# Patient Record
Sex: Female | Born: 1980 | Race: Black or African American | Hispanic: No | Marital: Married | Smoking: Never smoker
Health system: Southern US, Community
[De-identification: ages and names within clinical notes are randomized; demographics above are authoritative.]

## PROBLEM LIST (undated history)

## (undated) DIAGNOSIS — Z789 Other specified health status: Secondary | ICD-10-CM

## (undated) HISTORY — DX: Other specified health status: Z78.9

---

## 2018-05-02 ENCOUNTER — Ambulatory Visit: Payer: Self-pay | Attending: Family Medicine | Admitting: Family Medicine

## 2018-05-02 ENCOUNTER — Encounter: Payer: Self-pay | Admitting: Family Medicine

## 2018-05-02 VITALS — BP 146/87 | HR 79 | Temp 97.4°F | Wt 169.2 lb

## 2018-05-02 DIAGNOSIS — N926 Irregular menstruation, unspecified: Secondary | ICD-10-CM | POA: Insufficient documentation

## 2018-05-02 DIAGNOSIS — N925 Other specified irregular menstruation: Secondary | ICD-10-CM

## 2018-05-02 DIAGNOSIS — Z3202 Encounter for pregnancy test, result negative: Secondary | ICD-10-CM

## 2018-05-02 DIAGNOSIS — Z79899 Other long term (current) drug therapy: Secondary | ICD-10-CM | POA: Insufficient documentation

## 2018-05-02 DIAGNOSIS — R030 Elevated blood-pressure reading, without diagnosis of hypertension: Secondary | ICD-10-CM | POA: Insufficient documentation

## 2018-05-02 LAB — POCT URINE PREGNANCY: Preg Test, Ur: NEGATIVE

## 2018-05-02 MED ORDER — NORGESTIM-ETH ESTRAD TRIPHASIC 0.18/0.215/0.25 MG-25 MCG PO TABS: 1.0000 | ORAL_TABLET | Freq: Every day | ORAL | 11 refills | Status: DC

## 2018-05-02 MED FILL — NORG-EE 0.18-0.215-0.25/0.0: 0.18/0.215/ | 28 days supply | Qty: 28 | Fill #0

## 2018-05-02 NOTE — Progress Notes (Signed)
Subjective:  Patient ID: Marcia Hobbs, female    DOB: Apr 01, 1981  Age: 37 y.o. MRN: [id number]  CC: New Patient (Initial Visit)   HPI Marcia Hobbs is a 38 year old female from [place] who moved to the [place] 1and1/2 years ago and presents as a new patient to establish care and complains of irregular periods since she weaned her son in 10/2017. Prior to that she was having a normal menstrual cycle but afterwards had a cycle in August and the next one, 3 months later in November.  Length of cycle was 7 days and associated with heavy bleeding but no passage of clots, no abdominal cramping or pain, no nausea or vomiting. She is interested in conceiving again.   History reviewed. No pertinent past medical history.    Past Surgical History:  Procedure Laterality Date  . CESAREAN SECTION       Family history: Patient is unaware of family history  No outpatient medications prior to visit.   No facility-administered medications prior to visit.     ROS Review of Systems  Constitutional: Negative for activity change, appetite change and fatigue.  HENT: Negative for congestion, sinus pressure and sore throat.   Eyes: Negative for visual disturbance.  Respiratory: Negative for cough, chest tightness, shortness of breath and wheezing.   Cardiovascular: Negative for chest pain and palpitations.  Gastrointestinal: Negative for abdominal distention, abdominal pain and constipation.  Endocrine: Negative for polydipsia.  Genitourinary: Negative for dysuria and frequency.  Musculoskeletal: Negative for arthralgias and back pain.  Skin: Negative for rash.  Neurological: Negative for tremors, light-headedness and numbness.  Hematological: Does not bruise/bleed easily.  Psychiatric/Behavioral: Negative for agitation and behavioral problems.    Objective:  BP (!) 146/87   Pulse 79   Temp (!) 97.4 F (36.3 C) (Oral)   Wt 169 lb 3.2 oz (76.7 kg)   SpO2 100%    BP/Weight 05/02/2018  Systolic BP 146  Diastolic BP 87  Wt. (Lbs) 169.2      Physical Exam Constitutional:      Appearance: She is well-developed.  Cardiovascular:     Rate and Rhythm: Normal rate.     Heart sounds: Normal heart sounds. No murmur.  Pulmonary:     Effort: Pulmonary effort is normal.     Breath sounds: Normal breath sounds. No wheezing or rales.  Chest:     Chest wall: No tenderness.  Abdominal:     General: Bowel sounds are normal. There is no distension.     Palpations: Abdomen is soft. There is no mass.     Tenderness: There is no abdominal tenderness.  Musculoskeletal: Normal range of motion.  Neurological:     Mental Status: She is alert and oriented to person, place, and time.  Psychiatric:        Mood and Affect: Mood normal.        Behavior: Behavior normal.      Assessment & Plan:   1. Irregular periods Discussed risks and benefits of oral contraceptive pills in the light of her interest in conceiving again She would like to proceed with oral contraceptive pills to regularize her periods in the meantime - Basic Metabolic Panel - FSH/LH - POCT urine pregnancy - Norgestimate-Ethinyl Estradiol Triphasic (ORTHO TRI-CYCLEN LO) 0.18/0.215/0.25 MG-25 MCG tab; Take 1 tablet by mouth daily.  Dispense: 1 Package; Refill: 11  Blood pressure is elevated-discussed lifestyle modifications and will reassess at next visit.  Meds ordered this encounter  Medications  .  Norgestimate-Ethinyl Estradiol Triphasic (ORTHO TRI-CYCLEN LO) 0.18/0.215/0.25 MG-25 MCG tab    Sig: Take 1 tablet by mouth daily.    Dispense:  1 Package    Refill:  11    Follow-up: Return in about 3 months (around 08/01/2018) for follow up of irregular periods.   Hoy Register MD

## 2018-05-02 NOTE — Patient Instructions (Signed)
Metrorrhagia  Metrorrhagia is bleeding from the uterus that happens irregularly but often. The bleeding generally happens between menstrual periods.  Follow these instructions at home:  Pay attention to any changes in your symptoms. Let your health care provider know about them. Follow these instructions to help with your condition:  Eating and drinking    · Eat well-balanced meals. Include foods that are high in iron, such as liver, meat, shellfish, green leafy vegetables, and eggs.  · If you become constipated:  ? Drink plenty of water. Drink enough to keep your urine pale yellow.  ? Take over-the-counter or prescription medicines.  ? Eat foods that are high in fiber, such as beans, whole grains, and fresh fruits and vegetables.  ? Limit foods that are high in fat and processed sugars, such as fried or sweet foods.  Medicines  · Take over-the-counter and prescription medicines only as told by your health care provider.  · Do not change medicines without talking with your health care provider.  · Aspirin or medicines that contain aspirin may make the bleeding worse. Do not take these medicines:  ? During your period.  ? During the week before your period.  · If you were prescribed iron pills, take them as told by your health care provider. Iron pills help to replace iron that your body loses because of this condition.  Activity  · If you need to change your sanitary pad or tampon more than one time every 2 hours:  ? Lie in bed with your feet raised (elevated).  ? Place a cold pack on your lower abdomen.  ? Rest as much as possible until the bleeding stops or slows down.  General instructions    · For 2 months, write down:  ? When your period starts.  ? When your period ends.  ? When any abnormal bleeding occurs.  ? What problems you notice.  · Keep all follow-up visits as told by your health care provider. This is important.  Contact a health care provider if:  · You get light-headed or weak.  · You have nausea and  vomiting.  · You cannot eat or drink without vomiting.  · You feel dizzy or have diarrhea while you are taking medicine.  · Have questions about birth control.  Get help right away if:  · You develop a fever or chills.  · You need to change your sanitary pad or tampon more than one time per hour.  · Your bleeding becomes heavy.  · Your flow contains clots.  · You develop pain in your abdomen.  · You lose consciousness.  · You develop a rash.  Summary  · Metrorrhagia is bleeding from the uterus that happens irregularly but often, usually between menstrual periods.  · Pay attention to any changes in your symptoms. Let your health care provider know about them.  · Eat well-balanced meals. Include foods that are high in iron, such as liver, meat, shellfish, green leafy vegetables, and eggs.  · Get help right away if you develop a fever, you see clots in your blood, your bleeding becomes heavy, you develop a rash, or you lose consciousness.  This information is not intended to replace advice given to you by your health care provider. Make sure you discuss any questions you have with your health care provider.  Document Released: 04/11/2005 Document Revised: 10/12/2017 Document Reviewed: 10/12/2017  Elsevier Interactive Patient Education © 2019 [employer]

## 2018-05-02 NOTE — Progress Notes (Signed)
Patient states that she is having irregular periods.

## 2018-05-03 LAB — BASIC METABOLIC PANEL WITH GFR
BUN/Creatinine Ratio: 17 (ref 9–23)
BUN: 10 mg/dL (ref 6–20)
CO2: 22 mmol/L (ref 20–29)
Calcium: 9.4 mg/dL (ref 8.7–10.2)
Chloride: 100 mmol/L (ref 96–106)
Creatinine, Ser: 0.59 mg/dL (ref 0.57–1.00)
GFR calc Af Amer: 136 mL/min/{1.73_m2}
GFR calc non Af Amer: 118 mL/min/{1.73_m2}
Glucose: 89 mg/dL (ref 65–99)
Potassium: 3.9 mmol/L (ref 3.5–5.2)
Sodium: 135 mmol/L (ref 134–144)

## 2018-05-03 LAB — FSH/LH
FSH: 1.5 m[IU]/mL
LH: 2.3 m[IU]/mL

## 2018-05-22 ENCOUNTER — Ambulatory Visit
Admission: EM | Admit: 2018-05-22 | Discharge: 2018-05-22 | Discharge status: Absent without leave | Payer: Self-pay | Source: Ambulatory Visit

## 2018-05-28 ENCOUNTER — Telehealth: Payer: Self-pay

## 2018-05-28 NOTE — Telephone Encounter (Signed)
-----   Message from Hoy Register, MD sent at 05/03/2018  2:28 PM EST ----- Please inform the patient that labs are normal. Thank you.

## 2018-05-28 NOTE — Telephone Encounter (Signed)
Patient was called and informed of results via interpreter([id number])

## 2018-06-06 MED FILL — TRI-LO-MILI 0.18/0.215/0.25: 0.18/0.215/ | 28 days supply | Qty: 28 | Fill #1

## 2018-08-06 ENCOUNTER — Ambulatory Visit: Payer: Self-pay | Admitting: Family Medicine

## 2018-08-15 ENCOUNTER — Ambulatory Visit: Payer: Self-pay | Admitting: Family Medicine

## 2018-10-31 ENCOUNTER — Inpatient Hospital Stay (HOSPITAL_COMMUNITY)
Admission: AD | Admit: 2018-10-31 | Discharge: 2018-10-31 | Disposition: A | Discharge status: Home / Self Care | Payer: Medicaid Other | Attending: Obstetrics & Gynecology | Admitting: Obstetrics & Gynecology

## 2018-10-31 DIAGNOSIS — N912 Amenorrhea, unspecified: Secondary | ICD-10-CM | POA: Insufficient documentation

## 2018-10-31 NOTE — MAU Note (Signed)
Pt reports to MAU stating she has symptoms of pregnancy but wants to check if she is pregnant pt took a test at home but it was "weak". Pt reports her LMP was May 16. Pt has no symptoms no other problems. Pt reports no problems just wants to check with blood test to see if she is pregnant.   Interpreter: Mervat [id number]

## 2018-10-31 NOTE — MAU Provider Note (Signed)
[name] is a 38 y.o. No obstetric history on file. at Unknown who presents to MAU today for pregnancy verification. The patient denies abdominal pain or vaginal bleeding today.   BP 116/68 (BP Location: Right Arm)   Pulse 79   Temp 98.3 F (36.8 C) (Oral)   Resp 18   CONSTITUTIONAL: Well-developed, well-nourished female in no acute distress.  MUSCULOSKELETAL: Normal range of motion.  CARDIOVASCULAR: Regular heart rate RESPIRATORY: Normal effort NEUROLOGICAL: Alert and oriented to person, place, and time.  SKIN: No pallor. PSYCH: Normal mood and affect. Normal behavior. Normal judgment and thought content.  No results found for this or any previous visit (from the past 24 hour(s)).  MDM Patient advised that without concerning symptoms today UPT will not be performed in MAU at this time  A: Amenorrhea  P: Discharge home Pt advised that routine pregnancy tests are offered in the [facility] Monday- Friday 8:15-4:00 Patient may return to MAU as needed or if her condition were to change or worsen   Video interpreter used for encounter  Julianne Handler, CNM  10/31/2018 8:16 PM

## 2019-01-01 LAB — OB RESULTS CONSOLE HIV ANTIBODY (ROUTINE TESTING): HIV: NONREACTIVE

## 2019-01-01 LAB — OB RESULTS CONSOLE GC/CHLAMYDIA
Chlamydia: NEGATIVE
Gonorrhea: NEGATIVE

## 2019-01-01 LAB — OB RESULTS CONSOLE RPR: RPR: NONREACTIVE

## 2019-01-01 LAB — OB RESULTS CONSOLE RUBELLA ANTIBODY, IGM: Rubella: IMMUNE

## 2019-01-01 LAB — OB RESULTS CONSOLE ABO/RH: RH Type: POSITIVE

## 2019-01-01 LAB — OB RESULTS CONSOLE HEPATITIS B SURFACE ANTIGEN: Hepatitis B Surface Ag: NEGATIVE

## 2019-01-29 ENCOUNTER — Encounter (HOSPITAL_COMMUNITY): Payer: Self-pay

## 2019-02-05 ENCOUNTER — Other Ambulatory Visit (HOSPITAL_COMMUNITY): Payer: Self-pay | Admitting: Obstetrics and Gynecology

## 2019-02-05 DIAGNOSIS — Z3689 Encounter for other specified antenatal screening: Secondary | ICD-10-CM

## 2019-02-05 DIAGNOSIS — Z3A22 22 weeks gestation of pregnancy: Secondary | ICD-10-CM

## 2019-02-05 DIAGNOSIS — O09522 Supervision of elderly multigravida, second trimester: Secondary | ICD-10-CM

## 2019-02-12 ENCOUNTER — Encounter (HOSPITAL_COMMUNITY): Payer: Self-pay | Admitting: *Deleted

## 2019-02-15 ENCOUNTER — Other Ambulatory Visit: Payer: Self-pay

## 2019-02-15 ENCOUNTER — Ambulatory Visit (HOSPITAL_COMMUNITY)
Admission: RE | Admit: 2019-02-15 | Discharge: 2019-02-15 | Disposition: A | Discharge status: Home / Self Care | Payer: Medicaid Other | Source: Ambulatory Visit | Attending: Obstetrics and Gynecology | Admitting: Obstetrics and Gynecology

## 2019-02-15 ENCOUNTER — Ambulatory Visit (HOSPITAL_BASED_OUTPATIENT_CLINIC_OR_DEPARTMENT_OTHER): Payer: Medicaid Other | Admitting: Obstetrics

## 2019-02-15 ENCOUNTER — Encounter (HOSPITAL_COMMUNITY): Payer: Self-pay

## 2019-02-15 ENCOUNTER — Ambulatory Visit (HOSPITAL_COMMUNITY): Payer: Medicaid Other | Admitting: *Deleted

## 2019-02-15 VITALS — BP 111/64 | HR 85 | Temp 98.6°F | Ht 62.0 in | Wt 169.0 lb

## 2019-02-15 DIAGNOSIS — O0932 Supervision of pregnancy with insufficient antenatal care, second trimester: Secondary | ICD-10-CM | POA: Diagnosis not present

## 2019-02-15 DIAGNOSIS — Z3A22 22 weeks gestation of pregnancy: Secondary | ICD-10-CM | POA: Diagnosis present

## 2019-02-15 DIAGNOSIS — O34219 Maternal care for unspecified type scar from previous cesarean delivery: Secondary | ICD-10-CM | POA: Diagnosis not present

## 2019-02-15 DIAGNOSIS — O0990 Supervision of high risk pregnancy, unspecified, unspecified trimester: Secondary | ICD-10-CM

## 2019-02-15 DIAGNOSIS — Z3A21 21 weeks gestation of pregnancy: Secondary | ICD-10-CM | POA: Diagnosis not present

## 2019-02-15 DIAGNOSIS — O358XX0 Maternal care for other (suspected) fetal abnormality and damage, not applicable or unspecified: Secondary | ICD-10-CM | POA: Diagnosis not present

## 2019-02-15 DIAGNOSIS — Z3492 Encounter for supervision of normal pregnancy, unspecified, second trimester: Secondary | ICD-10-CM | POA: Insufficient documentation

## 2019-02-15 DIAGNOSIS — O09522 Supervision of elderly multigravida, second trimester: Secondary | ICD-10-CM

## 2019-02-15 DIAGNOSIS — Z3689 Encounter for other specified antenatal screening: Secondary | ICD-10-CM

## 2019-02-15 NOTE — Progress Notes (Addendum)
This patient was seen in consultation for a detailed fetal anatomy scan due to advanced maternal age and to confirm her dates.  The patient presented late for prenatal care.  She is uncertain of her last menstrual period as she has irregular cycles after she stopped breast-feeding. She denies any other significant past medical history and denies any problems in her current pregnancy.    The patient reports that she had a blood test earlier in her pregnancy which revealed a low risk for trisomy 46, 8, and 13.  The results of that test was unavailable today.  The fetal biometry measurements obtained today are consistent with an [facility] of June 27, 2019 (the same [facility] that was obtained from the ultrasound performed in your office).  The patient was advised that as she did not have a first trimester ultrasound performed in her current pregnancy, that this EDC would be our best estimate.  June 27, 2019 should be used as her final EDC.  Due to her uncertain dates, the patient should continue to be followed with serial growth ultrasounds.  These ultrasounds may be performed in your office or ours.  There were no obvious fetal anomalies noted on today's ultrasound exam.  The patient was informed that anomalies may be missed due to technical limitations. If the fetus is in a suboptimal position or maternal habitus is increased, visualization of the fetus in the maternal uterus may be impaired.  The increased risk of fetal aneuploidy due to advanced maternal age was discussed. Due to advanced maternal age, the patient was offered and declined an amniocentesis today for definitive diagnosis of fetal aneuploidy.  No further exams were scheduled in our office.  We would be happy to see her again in the future should you feel that it is indicated.  A total of 30 minutes was spent counseling and coordinating the care for this patient.  Greater than 50% of the time was spent in direct face-to-face contact.

## 2019-05-30 ENCOUNTER — Other Ambulatory Visit: Payer: Self-pay | Admitting: Obstetrics and Gynecology

## 2019-05-30 LAB — OB RESULTS CONSOLE GBS: GBS: POSITIVE

## 2019-06-10 ENCOUNTER — Encounter (HOSPITAL_COMMUNITY): Payer: Self-pay | Admitting: *Deleted

## 2019-06-10 ENCOUNTER — Telehealth (HOSPITAL_COMMUNITY): Payer: Self-pay | Admitting: *Deleted

## 2019-06-10 NOTE — Patient Instructions (Signed)
Marcia Hobbs  06/10/2019   Your procedure is scheduled on:  06/20/2019  Arrive at 1030 at Entrance C on [address] at [facility]  and [address]. You are invited to use the FREE valet parking or use the Visitor's parking deck.  Pick up the phone at the desk and dial [phone].  Call this number if you have problems the morning of surgery: [phone]  Remember:   Do not eat food:(After Midnight) Desps de medianoche.  Do not drink clear liquids: (After Midnight) Desps de medianoche.  Take these medicines the morning of surgery with A SIP OF WATER:  none   Do not wear jewelry, make-up or nail polish.  Do not wear lotions, powders, or perfumes. Do not wear deodorant.  Do not shave 48 hours prior to surgery.  Do not bring valuables to the hospital.  [facility] is not   responsible for any belongings or valuables brought to the hospital.  Contacts, dentures or bridgework may not be worn into surgery.  Leave suitcase in the car. After surgery it may be brought to your room.  For patients admitted to the hospital, checkout time is 11:00 AM the day of              discharge.      Please read over the following fact sheets that you were given:     Preparing for Surgery

## 2019-06-14 NOTE — Pre-Procedure Instructions (Signed)
Message left on 2/19 to review medical history, spoke with her husband at request per MD office regarding covid appt and instructions.

## 2019-06-14 NOTE — Pre-Procedure Instructions (Signed)
Interpreter number [phone]

## 2019-06-15 ENCOUNTER — Other Ambulatory Visit (HOSPITAL_COMMUNITY): Payer: Medicaid Other

## 2019-06-18 ENCOUNTER — Other Ambulatory Visit (HOSPITAL_COMMUNITY)
Admission: RE | Admit: 2019-06-18 | Discharge: 2019-06-18 | Disposition: A | Discharge status: Home / Self Care | Payer: Medicaid Other | Source: Ambulatory Visit | Attending: Obstetrics and Gynecology | Admitting: Obstetrics and Gynecology

## 2019-06-18 ENCOUNTER — Other Ambulatory Visit: Payer: Self-pay

## 2019-06-18 DIAGNOSIS — Z20822 Contact with and (suspected) exposure to covid-19: Secondary | ICD-10-CM | POA: Insufficient documentation

## 2019-06-18 DIAGNOSIS — Z01812 Encounter for preprocedural laboratory examination: Secondary | ICD-10-CM | POA: Insufficient documentation

## 2019-06-18 LAB — CBC
HCT: 39.7 % (ref 36.0–46.0)
Hemoglobin: 13 g/dL (ref 12.0–15.0)
MCH: 31.1 pg (ref 26.0–34.0)
MCHC: 32.7 g/dL (ref 30.0–36.0)
MCV: 95 fL (ref 80.0–100.0)
Platelets: 196 10*3/uL (ref 150–400)
RBC: 4.18 MIL/uL (ref 3.87–5.11)
RDW: 13.6 % (ref 11.5–15.5)
WBC: 6.8 10*3/uL (ref 4.0–10.5)
nRBC: 0 % (ref 0.0–0.2)

## 2019-06-18 LAB — RPR: RPR Ser Ql: NONREACTIVE

## 2019-06-18 LAB — TYPE AND SCREEN
ABO/RH(D): A POS
Antibody Screen: NEGATIVE

## 2019-06-18 LAB — ABO/RH: ABO/RH(D): A POS

## 2019-06-18 LAB — SARS CORONAVIRUS 2 (TAT 6-24 HRS): SARS Coronavirus 2: NEGATIVE

## 2019-06-18 NOTE — MAU Note (Signed)
Pt had many questions, .....asked if we could check her cervix.  Pt denies ctx's, pain, leaking fluid or bleeding.  Asking about c/s, if there will be a translator, asking about [place] results. Dr Sallye Ober did not answer call, went to voicemail.  Advised pt to call office with her questions as I do not have access or the answers.

## 2019-06-18 NOTE — MAU Note (Signed)
Swab collected, lab drawn

## 2019-06-19 NOTE — Anesthesia Preprocedure Evaluation (Addendum)
Anesthesia Evaluation  Patient identified by MRN, date of birth, ID band Patient awake    Reviewed: Allergy & Precautions, NPO status , Patient's Chart, lab work & pertinent test results  Airway Mallampati: I  TM Distance: >3 FB Neck ROM: Full    Dental no notable dental hx. (+) Teeth Intact, Dental Advisory Given   Pulmonary neg pulmonary ROS,    Pulmonary exam normal breath sounds clear to auscultation       Cardiovascular negative cardio ROS Normal cardiovascular exam Rhythm:Regular Rate:Normal     Neuro/Psych negative neurological ROS  negative psych ROS   GI/Hepatic negative GI ROS, Neg liver ROS,   Endo/Other  negative endocrine ROS  Renal/GU negative Renal ROS  negative genitourinary   Musculoskeletal negative musculoskeletal ROS (+)   Abdominal Normal abdominal exam  (+)   Peds negative pediatric ROS (+)  Hematology negative hematology ROS (+) hct 39.7, plt 196   Anesthesia Other Findings Arabic speaking only  Reproductive/Obstetrics (+) Pregnancy 1 prior c section                            Anesthesia Physical Anesthesia Plan  ASA: II  Anesthesia Plan: Spinal   Post-op Pain Management:    Induction:   PONV Risk Score and Plan:   Airway Management Planned: Natural Airway and Nasal Cannula  Additional Equipment: None  Intra-op Plan:   Post-operative Plan:   Informed Consent: I have reviewed the patients History and Physical, chart, labs and discussed the procedure including the risks, benefits and alternatives for the proposed anesthesia with the patient or authorized representative who has indicated his/her understanding and acceptance.       Plan Discussed with: CRNA  Anesthesia Plan Comments:        Anesthesia Quick Evaluation

## 2019-06-20 ENCOUNTER — Encounter (HOSPITAL_COMMUNITY)
Admission: RE | Disposition: A | Discharge status: Home / Self Care | Payer: Self-pay | Source: Home / Self Care | Attending: Obstetrics and Gynecology

## 2019-06-20 ENCOUNTER — Inpatient Hospital Stay (HOSPITAL_COMMUNITY)
Admission: RE | Admit: 2019-06-20 | Discharge: 2019-06-22 | DRG: 788 | Disposition: A | Discharge status: Home / Self Care | Payer: Medicaid Other | Attending: Obstetrics and Gynecology | Admitting: Obstetrics and Gynecology

## 2019-06-20 ENCOUNTER — Inpatient Hospital Stay (HOSPITAL_COMMUNITY): Payer: Medicaid Other | Admitting: Anesthesiology

## 2019-06-20 ENCOUNTER — Encounter (HOSPITAL_COMMUNITY): Payer: Self-pay | Admitting: Obstetrics and Gynecology

## 2019-06-20 ENCOUNTER — Other Ambulatory Visit: Payer: Self-pay

## 2019-06-20 DIAGNOSIS — O365930 Maternal care for other known or suspected poor fetal growth, third trimester, not applicable or unspecified: Secondary | ICD-10-CM | POA: Diagnosis present

## 2019-06-20 DIAGNOSIS — O34211 Maternal care for low transverse scar from previous cesarean delivery: Secondary | ICD-10-CM | POA: Diagnosis present

## 2019-06-20 DIAGNOSIS — Z3A39 39 weeks gestation of pregnancy: Secondary | ICD-10-CM

## 2019-06-20 DIAGNOSIS — Z98891 History of uterine scar from previous surgery: Secondary | ICD-10-CM

## 2019-06-20 DIAGNOSIS — O99824 Streptococcus B carrier state complicating childbirth: Secondary | ICD-10-CM | POA: Diagnosis present

## 2019-06-20 SURGERY — Surgical Case
Anesthesia: Spinal

## 2019-06-20 MED ORDER — KETOROLAC TROMETHAMINE 30 MG/ML IJ SOLN: 30.0000 mg | Freq: Four times a day (QID) | INTRAMUSCULAR | Status: AC | PRN

## 2019-06-20 MED ORDER — OXYTOCIN 40 UNITS IN NORMAL SALINE INFUSION - SIMPLE MED: 2.5000 [IU]/h | INTRAVENOUS | Status: AC

## 2019-06-20 MED ORDER — PROMETHAZINE HCL 25 MG/ML IJ SOLN: 6.2500 mg | INTRAMUSCULAR | Status: DC | PRN

## 2019-06-20 MED ORDER — SODIUM CHLORIDE 0.9 % IV SOLN: INTRAVENOUS | Status: DC | PRN

## 2019-06-20 MED ORDER — SIMETHICONE 80 MG PO CHEW
80.0000 mg | CHEWABLE_TABLET | Freq: Three times a day (TID) | ORAL | Status: DC
  Administered 2019-06-21 – 2019-06-22 (×4): 80 mg via ORAL
  Filled 2019-06-20 (×4): qty 1

## 2019-06-20 MED ORDER — NALBUPHINE HCL 10 MG/ML IJ SOLN: 5.0000 mg | INTRAMUSCULAR | Status: DC | PRN

## 2019-06-20 MED ORDER — WITCH HAZEL-GLYCERIN EX PADS: 1.0000 "application " | MEDICATED_PAD | CUTANEOUS | Status: DC | PRN

## 2019-06-20 MED ORDER — COCONUT OIL OIL: 1.0000 "application " | TOPICAL_OIL | Status: DC | PRN

## 2019-06-20 MED ORDER — SIMETHICONE 80 MG PO CHEW: 80.0000 mg | CHEWABLE_TABLET | ORAL | Status: DC | PRN

## 2019-06-20 MED ORDER — KETOROLAC TROMETHAMINE 30 MG/ML IJ SOLN
30.0000 mg | Freq: Four times a day (QID) | INTRAMUSCULAR | Status: AC
  Administered 2019-06-20 – 2019-06-21 (×3): 30 mg via INTRAVENOUS
  Filled 2019-06-20 (×3): qty 1

## 2019-06-20 MED ORDER — OXYTOCIN 40 UNITS IN NORMAL SALINE INFUSION - SIMPLE MED
INTRAVENOUS | Status: DC | PRN
  Administered 2019-06-20: 40 [IU] via INTRAVENOUS

## 2019-06-20 MED ORDER — ONDANSETRON HCL 4 MG/2ML IJ SOLN
INTRAMUSCULAR | Status: AC
  Filled 2019-06-20: qty 2

## 2019-06-20 MED ORDER — MORPHINE SULFATE (PF) 0.5 MG/ML IJ SOLN
INTRAMUSCULAR | Status: DC | PRN
  Administered 2019-06-20: .15 mg via INTRATHECAL

## 2019-06-20 MED ORDER — ACETAMINOPHEN 10 MG/ML IV SOLN
INTRAVENOUS | Status: AC
  Filled 2019-06-20: qty 100

## 2019-06-20 MED ORDER — SCOPOLAMINE 1 MG/3DAYS TD PT72: 1.0000 | MEDICATED_PATCH | Freq: Once | TRANSDERMAL | Status: DC

## 2019-06-20 MED ORDER — KETOROLAC TROMETHAMINE 30 MG/ML IJ SOLN
30.0000 mg | Freq: Once | INTRAMUSCULAR | Status: AC
  Administered 2019-06-20: 30 mg via INTRAVENOUS

## 2019-06-20 MED ORDER — OXYCODONE HCL 5 MG/5ML PO SOLN: 5.0000 mg | Freq: Once | ORAL | Status: DC | PRN

## 2019-06-20 MED ORDER — DIPHENHYDRAMINE HCL 25 MG PO CAPS: 25.0000 mg | ORAL_CAPSULE | ORAL | Status: DC | PRN

## 2019-06-20 MED ORDER — MEPERIDINE HCL 25 MG/ML IJ SOLN: 6.2500 mg | INTRAMUSCULAR | Status: DC | PRN

## 2019-06-20 MED ORDER — KETOROLAC TROMETHAMINE 30 MG/ML IJ SOLN
INTRAMUSCULAR | Status: AC
  Filled 2019-06-20: qty 1

## 2019-06-20 MED ORDER — LACTATED RINGERS IV SOLN: INTRAVENOUS | Status: DC | PRN

## 2019-06-20 MED ORDER — SIMETHICONE 80 MG PO CHEW
80.0000 mg | CHEWABLE_TABLET | ORAL | Status: DC
  Administered 2019-06-20 – 2019-06-21 (×2): 80 mg via ORAL
  Filled 2019-06-20 (×2): qty 1

## 2019-06-20 MED ORDER — NALBUPHINE HCL 10 MG/ML IJ SOLN: 5.0000 mg | Freq: Once | INTRAMUSCULAR | Status: DC | PRN

## 2019-06-20 MED ORDER — SODIUM CHLORIDE 0.9% FLUSH: 3.0000 mL | INTRAVENOUS | Status: DC | PRN

## 2019-06-20 MED ORDER — OXYCODONE HCL 5 MG PO TABS
5.0000 mg | ORAL_TABLET | ORAL | Status: DC | PRN
  Administered 2019-06-21: 10 mg via ORAL
  Filled 2019-06-20: qty 2

## 2019-06-20 MED ORDER — DIBUCAINE (PERIANAL) 1 % EX OINT: 1.0000 "application " | TOPICAL_OINTMENT | CUTANEOUS | Status: DC | PRN

## 2019-06-20 MED ORDER — OXYCODONE HCL 5 MG PO TABS: 5.0000 mg | ORAL_TABLET | Freq: Once | ORAL | Status: DC | PRN

## 2019-06-20 MED ORDER — DIPHENHYDRAMINE HCL 50 MG/ML IJ SOLN: 12.5000 mg | INTRAMUSCULAR | Status: DC | PRN

## 2019-06-20 MED ORDER — CEFAZOLIN SODIUM-DEXTROSE 2-4 GM/100ML-% IV SOLN
2.0000 g | INTRAVENOUS | Status: AC
  Administered 2019-06-20: 2 g via INTRAVENOUS

## 2019-06-20 MED ORDER — IBUPROFEN 800 MG PO TABS
800.0000 mg | ORAL_TABLET | Freq: Four times a day (QID) | ORAL | Status: DC
  Administered 2019-06-21 – 2019-06-22 (×4): 800 mg via ORAL
  Filled 2019-06-20 (×5): qty 1

## 2019-06-20 MED ORDER — MENTHOL 3 MG MT LOZG: 1.0000 | LOZENGE | OROMUCOSAL | Status: DC | PRN

## 2019-06-20 MED ORDER — FENTANYL CITRATE (PF) 100 MCG/2ML IJ SOLN
INTRAMUSCULAR | Status: AC
  Filled 2019-06-20: qty 2

## 2019-06-20 MED ORDER — MORPHINE SULFATE (PF) 0.5 MG/ML IJ SOLN
INTRAMUSCULAR | Status: AC
  Filled 2019-06-20: qty 10

## 2019-06-20 MED ORDER — PHENYLEPHRINE HCL-NACL 20-0.9 MG/250ML-% IV SOLN
INTRAVENOUS | Status: DC | PRN
  Administered 2019-06-20: 60 ug/min via INTRAVENOUS

## 2019-06-20 MED ORDER — STERILE WATER FOR IRRIGATION IR SOLN
Status: DC | PRN
  Administered 2019-06-20: 1000 mL

## 2019-06-20 MED ORDER — BUPIVACAINE IN DEXTROSE 0.75-8.25 % IT SOLN
INTRATHECAL | Status: DC | PRN
  Administered 2019-06-20: 1.7 mg via INTRATHECAL

## 2019-06-20 MED ORDER — ONDANSETRON HCL 4 MG/2ML IJ SOLN
INTRAMUSCULAR | Status: DC | PRN
  Administered 2019-06-20: 4 mg via INTRAVENOUS

## 2019-06-20 MED ORDER — LACTATED RINGERS IV SOLN: INTRAVENOUS | Status: DC

## 2019-06-20 MED ORDER — PHENYLEPHRINE HCL-NACL 20-0.9 MG/250ML-% IV SOLN
INTRAVENOUS | Status: AC
  Filled 2019-06-20: qty 250

## 2019-06-20 MED ORDER — OXYTOCIN 40 UNITS IN NORMAL SALINE INFUSION - SIMPLE MED
INTRAVENOUS | Status: AC
  Filled 2019-06-20: qty 1000

## 2019-06-20 MED ORDER — KETOROLAC TROMETHAMINE 30 MG/ML IJ SOLN: 30.0000 mg | Freq: Once | INTRAMUSCULAR | Status: DC | PRN

## 2019-06-20 MED ORDER — SODIUM CHLORIDE 0.9 % IR SOLN
Status: DC | PRN
  Administered 2019-06-20: 1

## 2019-06-20 MED ORDER — SENNOSIDES-DOCUSATE SODIUM 8.6-50 MG PO TABS
2.0000 | ORAL_TABLET | ORAL | Status: DC
  Administered 2019-06-20 – 2019-06-21 (×2): 2 via ORAL
  Filled 2019-06-20 (×2): qty 2

## 2019-06-20 MED ORDER — NALOXONE HCL 4 MG/10ML IJ SOLN
1.0000 ug/kg/h | INTRAVENOUS | Status: DC | PRN
  Filled 2019-06-20: qty 5

## 2019-06-20 MED ORDER — FENTANYL CITRATE (PF) 100 MCG/2ML IJ SOLN
INTRAMUSCULAR | Status: DC | PRN
  Administered 2019-06-20: 15 ug via INTRATHECAL

## 2019-06-20 MED ORDER — TETANUS-DIPHTH-ACELL PERTUSSIS 5-2.5-18.5 LF-MCG/0.5 IM SUSP: 0.5000 mL | Freq: Once | INTRAMUSCULAR | Status: DC

## 2019-06-20 MED ORDER — CEFAZOLIN SODIUM-DEXTROSE 2-4 GM/100ML-% IV SOLN
INTRAVENOUS | Status: AC
  Filled 2019-06-20: qty 100

## 2019-06-20 MED ORDER — NALOXONE HCL 0.4 MG/ML IJ SOLN: 0.4000 mg | INTRAMUSCULAR | Status: DC | PRN

## 2019-06-20 MED ORDER — ONDANSETRON HCL 4 MG/2ML IJ SOLN: 4.0000 mg | Freq: Three times a day (TID) | INTRAMUSCULAR | Status: DC | PRN

## 2019-06-20 MED ORDER — HYDROMORPHONE HCL 1 MG/ML IJ SOLN: 0.2500 mg | INTRAMUSCULAR | Status: DC | PRN

## 2019-06-20 MED ORDER — ACETAMINOPHEN 500 MG PO TABS
1000.0000 mg | ORAL_TABLET | Freq: Four times a day (QID) | ORAL | Status: AC
  Administered 2019-06-20 – 2019-06-21 (×3): 1000 mg via ORAL
  Filled 2019-06-20 (×3): qty 2

## 2019-06-20 MED ORDER — DIPHENHYDRAMINE HCL 25 MG PO CAPS: 25.0000 mg | ORAL_CAPSULE | Freq: Four times a day (QID) | ORAL | Status: DC | PRN

## 2019-06-20 MED ORDER — PRENATAL MULTIVITAMIN CH
1.0000 | ORAL_TABLET | Freq: Every day | ORAL | Status: DC
  Filled 2019-06-20 (×2): qty 1

## 2019-06-20 MED ORDER — ZOLPIDEM TARTRATE 5 MG PO TABS: 5.0000 mg | ORAL_TABLET | Freq: Every evening | ORAL | Status: DC | PRN

## 2019-06-20 MED ORDER — DEXAMETHASONE SODIUM PHOSPHATE 10 MG/ML IJ SOLN
INTRAMUSCULAR | Status: AC
  Filled 2019-06-20: qty 1

## 2019-06-20 MED ORDER — DEXAMETHASONE SODIUM PHOSPHATE 10 MG/ML IJ SOLN
INTRAMUSCULAR | Status: DC | PRN
  Administered 2019-06-20: 10 mg via INTRAVENOUS

## 2019-06-20 SURGICAL SUPPLY — 36 items
BENZOIN TINCTURE PRP APPL 2/3 (GAUZE/BANDAGES/DRESSINGS) ×3 IMPLANT
CHLORAPREP W/TINT 26ML (MISCELLANEOUS) ×3 IMPLANT
CLAMP CORD UMBIL (MISCELLANEOUS) IMPLANT
CLOSURE STERI STRIP 1/2 X4 (GAUZE/BANDAGES/DRESSINGS) ×6 IMPLANT
CLOSURE WOUND 1/2 X4 (GAUZE/BANDAGES/DRESSINGS) ×1
CLOTH BEACON ORANGE TIMEOUT ST (SAFETY) ×3 IMPLANT
DRSG OPSITE POSTOP 4X10 (GAUZE/BANDAGES/DRESSINGS) ×3 IMPLANT
ELECT REM PT RETURN 9FT ADLT (ELECTROSURGICAL) ×3
ELECTRODE REM PT RTRN 9FT ADLT (ELECTROSURGICAL) ×1 IMPLANT
EXTRACTOR VACUUM M CUP 4 TUBE (SUCTIONS) IMPLANT
EXTRACTOR VACUUM M CUP 4' TUBE (SUCTIONS)
GLOVE BIO SURGEON STRL SZ7.5 (GLOVE) ×3 IMPLANT
GLOVE BIOGEL PI IND STRL 7.0 (GLOVE) ×1 IMPLANT
GLOVE BIOGEL PI IND STRL 7.5 (GLOVE) ×1 IMPLANT
GLOVE BIOGEL PI INDICATOR 7.0 (GLOVE) ×2
GLOVE BIOGEL PI INDICATOR 7.5 (GLOVE) ×2
GOWN STRL REUS W/TWL LRG LVL3 (GOWN DISPOSABLE) ×6 IMPLANT
KIT ABG SYR 3ML LUER SLIP (SYRINGE) IMPLANT
NEEDLE HYPO 25X5/8 SAFETYGLIDE (NEEDLE) IMPLANT
NS IRRIG 1000ML POUR BTL (IV SOLUTION) ×3 IMPLANT
PACK C SECTION WH (CUSTOM PROCEDURE TRAY) ×3 IMPLANT
PAD OB MATERNITY 4.3X12.25 (PERSONAL CARE ITEMS) ×3 IMPLANT
PENCIL SMOKE EVAC W/HOLSTER (ELECTROSURGICAL) ×3 IMPLANT
RTRCTR C-SECT PINK 25CM LRG (MISCELLANEOUS) ×3 IMPLANT
STRIP CLOSURE SKIN 1/2X4 (GAUZE/BANDAGES/DRESSINGS) ×2 IMPLANT
SUT CHROMIC 2 0 CT 1 (SUTURE) ×3 IMPLANT
SUT MNCRL AB 3-0 PS2 27 (SUTURE) ×3 IMPLANT
SUT PLAIN 2 0 XLH (SUTURE) ×3 IMPLANT
SUT VIC AB 0 CT1 36 (SUTURE) ×3 IMPLANT
SUT VIC AB 0 CTX 36 (SUTURE) ×6
SUT VIC AB 0 CTX36XBRD ANBCTRL (SUTURE) ×3 IMPLANT
SUT VIC AB 2-0 SH 27 (SUTURE) ×4
SUT VIC AB 2-0 SH 27XBRD (SUTURE) ×2 IMPLANT
TOWEL OR 17X24 6PK STRL BLUE (TOWEL DISPOSABLE) ×3 IMPLANT
TRAY FOLEY W/BAG SLVR 14FR LF (SET/KITS/TRAYS/PACK) ×3 IMPLANT
WATER STERILE IRR 1000ML POUR (IV SOLUTION) ×3 IMPLANT

## 2019-06-20 NOTE — Op Note (Signed)
Cesarean Section Procedure Note  Indications: P1 at 39wks with h/o c-section presenting for repeat c-section.  Translator used for consenting.  Pre-operative Diagnosis: 1.39wks 2.Prior Cesarean Section   Post-operative Diagnosis: 1.39wks 2.Prior Cesarean Section  Procedure: REPEAT CESAREAN SECTION  Surgeon: Osborn Coho, MD    Assistants: Bernerd Pho, CNM  Anesthesia: Regional  Anesthesiologist: Lannie Fields, DO   Procedure Details  The patient was taken to the operating room secondary to h/o c-section after the risks, benefits, complications, treatment options, and expected outcomes were discussed with the patient.  The patient concurred with the proposed plan, giving informed consent which was signed and witnessed. The patient was taken to Operating Room B, identified as Marcia Hobbs and the procedure verified as C-Section Delivery. A Time Out was held and the above information confirmed.  After induction of anesthesia by obtaining a spinal, the patient was prepped and draped in the usual sterile manner. A Pfannenstiel skin incision was made and carried down through the subcutaneous tissue to the underlying layer of fascia.  The fascia was incised bilaterally and extended transversely bilaterally with the Mayo scissors. Kocher clamps were placed on the inferior aspect of the fascial incision and the underlying rectus muscle was separated from the fascia. The same was done on the superior aspect of the fascial incision.  The peritoneum was identified, entered bluntly and extended manually.  An Alexis self-retaining retractor was placed.  The utero-vesical peritoneal reflection was incised transversely and the bladder flap was bluntly freed from the lower uterine segment. A low transverse uterine incision was made with the scalpel and extended bilaterally with the bandage scissors.  The infant was delivered in vertex position without difficulty.  After the umbilical  cord was clamped and cut, the infant was handed to the awaiting pediatricians.  Cord blood was obtained for evaluation.  The placenta was removed intact and appeared to be within normal limits. The uterus was cleared of all clots and debris. The uterine incision was closed with running interlocking sutures of 0 Vicryl and a second imbricating layer was performed as well.   Bilateral tubes and ovaries appeared to be within normal limits.  Good hemostasis was noted.  Copious irrigation was performed until clear.  The peritoneum was repaired with 2-0 chromic via a running suture.  The fascia was reapproximated with a running suture of 0 Vicryl. The subcutaneous tissue was reapproximated with 3 interrupted sutures of 2-0 plain.  The skin was reapproximated with a subcuticular suture of 3-0 monocryl.  Steristrips were applied.  Instrument, sponge, and needle counts were correct prior to abdominal closure and at the conclusion of the case.  The patient was awaiting transfer to the recovery room in good condition.  Findings: Live female infant with Apgars 9 at one minute and 9 at five minutes.  Normal appearing bilateral ovaries and fallopian tubes were noted.  Estimated Blood Loss:  33ml         Drains: foley to gravity 500 cc         Total IV Fluids: 2100 ml         Specimens to Pathology: none         Complications:  None; patient tolerated the procedure well.         Disposition: PACU - hemodynamically stable.         Condition: stable  Attending Attestation: I performed the procedure.

## 2019-06-20 NOTE — Progress Notes (Signed)
Video interpreter used to talk to patient and FOB at bedside about the use of an interpreter since he speaks english. Patient stated that she does not need an interpreter for all staff interactions and would like for the FOB to interpret for her. They said if they did not understand something that comes up they will ask for an interpreter at that time. Family interpreter sheet signed by both patient and FOB. Royston Cowper, RN

## 2019-06-20 NOTE — Anesthesia Procedure Notes (Addendum)
Spinal  Patient location during procedure: OR Start time: 06/20/2019 12:10 PM End time: 06/20/2019 12:15 PM Staffing Performed: anesthesiologist  Anesthesiologist: Lannie Fields, DO Preanesthetic Checklist Completed: patient identified, IV checked, risks and benefits discussed, surgical consent, monitors and equipment checked, pre-op evaluation and timeout performed Spinal Block Patient position: sitting Prep: DuraPrep and site prepped and draped Patient monitoring: cardiac monitor, continuous pulse ox and blood pressure Approach: midline Location: L3-4 Injection technique: single-shot Needle Needle type: Pencan  Needle gauge: 24 G Needle length: 9 cm Assessment Sensory level: T6 Additional Notes Functioning IV was confirmed and monitors were applied. Sterile prep and drape, including hand hygiene and sterile gloves were used. The patient was positioned and the spine was prepped. The skin was anesthetized with lidocaine.  Free flow of clear CSF was obtained prior to injecting local anesthetic into the CSF.  The spinal needle aspirated freely following injection.  The needle was carefully withdrawn.  The patient tolerated the procedure well.

## 2019-06-20 NOTE — H&P (Addendum)
Marcia Hobbs is a 39 y.o. female, G2P1001 at 31 weeks, presenting for repeat low transverse cesarean section..  Pregnancy remarkable for AMA and late entrance into care and SGA. Pt first LTCS was done in [place] due to placental calcification.  Patient Active Problem List   Diagnosis Date Noted  . Advanced maternal age in multigravida, second trimester   . [redacted] weeks gestation of pregnancy     History of present pregnancy: Patient entered care at 20 weeks.   EDC of 3/4/2021was established by LMP.   Anatomy scan:  20 weeks, with normal findings and an anterior placenta.   Additional [place] evaluations:   Monthly growth [place] Significant prenatal events:  Last evaluation: 2/17 [place] last week fetus EFW 20% AFI wnl  OB History    Gravida  2   Para  1   Term  1   Preterm      AB      Living  1     SAB      TAB      Ectopic      Multiple      Live Births             Past Medical History:  Diagnosis Date  . Medical history non-contributory    Past Surgical History:  Procedure Laterality Date  . CESAREAN SECTION     Family History: family history is not on file. Social History:  reports that she has never smoked. She has never used smokeless tobacco. She reports that she does not drink alcohol or use drugs.   Prenatal Transfer Tool  Maternal Diabetes: No Genetic Screening: Too late declined QUAD Maternal Ultrasounds/Referrals: Normal Fetal Ultrasounds or other Referrals:  Referred to Materal Fetal Medicine  Maternal Substance Abuse:  No Significant Maternal Medications:  None Significant Maternal Lab Results: Group B Strep positive  TDAP Declined Flu Declined  ROS:  All 10 systems reviewed with video intereptor and negative except as stated above.  No Known Allergies     Last menstrual period 09/08/2018.  Chest clear Heart RRR without murmur Abd gravid, NT, FH appropriate Pelvic: deferred Ext: Neg  FHR: Positive UCs:  None  Prenatal  labs: ABO, Rh: --/--/A POS, A POS Performed at [facility], [address], [place], [place] [address]  [id number]/23 1024) Antibody: NEG (02/23 1024) Rubella:  Immune (09/08 0000) RPR: NON REACTIVE (02/23 1024)  HBsAg: Negative (09/08 0000)  HIV: Non-reactive (09/08 0000)  GBS: Positive/-- (02/04 0000) Sickle cell/Hgb electrophoresis:  AA Pap:  Not done GC:  Neg Chlamydia:  Neg Genetic screenings:  Declined Glucola:  96 Other:   Hgb 12.5   at NOB, 12.8 at 28 weeks       Assessment/Plan: IUP at 39 week presents for repeat LTCS GBS positive Plan: Admit to Preop Routine [facility] orders Pain med/epidural prn   Henderson Newcomer ProtheroCNM, MSN 06/20/2019, 11:05 AM   Consent reviewed and risks benefits alternatives discussed including but not limited to bleeding infection and injury.  Questions answered and consent signed and witnessed.

## 2019-06-20 NOTE — Transfer of Care (Signed)
Immediate Anesthesia Transfer of Care Note  Patient: Marcia Hobbs  Procedure(s) Performed: REPEAT CESAREAN SECTION (N/A )  Patient Location: PACU  Anesthesia Type:Spinal  Level of Consciousness: awake  Airway & Oxygen Therapy: Patient Spontanous Breathing  Post-op Assessment: Report given to RN  Post vital signs: Reviewed and stable  Last Vitals:  Vitals Value Taken Time  BP    Temp    Pulse 70 06/20/19 1349  Resp 19 06/20/19 1349  SpO2 100 % 06/20/19 1349  Vitals shown include unvalidated device data.  Last Pain:  Vitals:   06/20/19 1111  TempSrc: Oral         Complications: No apparent anesthesia complications

## 2019-06-20 NOTE — Lactation Note (Signed)
This note was copied from a baby's chart. Lactation Consultation Note FOB interpret for mom. Refused need to call language line.  Baby 9 hrs old. Mom is breast/formula feeding. Gave mom and FOB LPI information sheet about supplementing d/t less than 6 lbs. Reviewed. Mom stated she has no milk and will give formula until her milk comes in. Mom stated she BF her 2 1/38 yr old for 2 yrs. He didn't like formula or bottles and it took a long time to get him off the breast. She didn't want this baby to be that way. LC discussed supplementing after BF. BF when the baby wants to feed, supplement every 3 hrs. Mom has everted nipples. Hand expression taught w/barely a glisten noted. After massage and hand expression for over a minute. Similac 22 cal. Given. LC gave baby bottle w/yellow nipple. Baby leaked formula from corners of mouth. Purple nipple used much better. Baby tolerated well. Discussed pumping. Mom shown how to use DEBP & how to disassemble, clean, & reassemble parts. Mom knows to pump q3h for 15-20 min. Suggested mom pump since she didn't put the baby to the breast, mom stated she just got to bed and needs to rest. RN had gotten mom up to BR for the first time. It took a while. Newborn behavior, STS, I&O, milk storage, formula usage, breast massage, supply and demand. Encouraged mom to hold baby STS when BF. Baby was a little cold, LC put a t-shirt on the baby when mom getting up to BF. LC wrapped baby in 2 blankets.  Encouraged mom to call for assistance in BF if needed. Lactation brochure given.  Patient Name: [facility] SWNIO'E Date: 06/20/2019 Reason for consult: Initial assessment;Term;Infant < 6lbs   Maternal Data Has patient been taught Hand Expression?: Yes Does the patient have breastfeeding experience prior to this delivery?: Yes  Feeding Feeding Type: Formula Nipple Type: Nfant Slow Flow (purple)  LATCH Score       Type of Nipple: Everted at rest and after  stimulation  Comfort (Breast/Nipple): Soft / non-tender        Interventions Interventions: Breast feeding basics reviewed;Position options;Skin to skin;Breast massage;DEBP;Breast compression;Adjust position;Reverse pressure;Hand express;Support pillows  Lactation Tools Discussed/Used Tools: Pump Breast pump type: Double-Electric Breast Pump WIC Program: No Pump Review: Setup, frequency, and cleaning;Milk Storage Initiated by:: L> Rim Thatch RN IBCLC Date initiated:: 06/20/19   Consult Status Consult Status: Follow-up Date: 06/21/19 Follow-up type: In-patient    Charyl Dancer 06/20/2019, 10:18 PM

## 2019-06-20 NOTE — Progress Notes (Signed)
Video interpreter ID# [id number] used to admit patient to unit. Patient/baby safety, falls education, plan of care discussed. Patient and support person in understanding, no further questions or concerns.

## 2019-06-20 NOTE — Anesthesia Postprocedure Evaluation (Signed)
Anesthesia Post Note  Patient: Marcia [facility]  Procedure(s) Performed: REPEAT CESAREAN SECTION (N/A )     Patient location during evaluation: PACU Anesthesia Type: Spinal Level of consciousness: awake and alert and oriented Pain management: pain level controlled Vital Signs Assessment: post-procedure vital signs reviewed and stable Respiratory status: spontaneous breathing, nonlabored ventilation and respiratory function stable Cardiovascular status: blood pressure returned to baseline and stable Postop Assessment: no headache, no backache, spinal receding and patient able to bend at knees Anesthetic complications: no    Last Vitals:  Vitals:   06/20/19 1528 06/20/19 1651  BP: 101/67 101/60  Pulse: (!) 59 60  Resp: 16 18  Temp: (!) 36.4 C 36.4 C  SpO2:  100%    Last Pain:  Vitals:   06/20/19 1651  TempSrc: Oral   Pain Goal:                Epidural/Spinal Function Cutaneous sensation: Tingles (06/20/19 1651), Patient able to flex knees: Yes (06/20/19 1651), Patient able to lift hips off bed: Yes (06/20/19 1651), Back pain beyond tenderness at insertion site: No (06/20/19 1651), Progressively worsening motor and/or sensory loss: No (06/20/19 1651), Bowel and/or bladder incontinence post epidural: No (06/20/19 1651)  Lannie Fields

## 2019-06-21 LAB — CBC
HCT: 32.6 % — ABNORMAL LOW (ref 36.0–46.0)
Hemoglobin: 10.8 g/dL — ABNORMAL LOW (ref 12.0–15.0)
MCH: 31.5 pg (ref 26.0–34.0)
MCHC: 33.1 g/dL (ref 30.0–36.0)
MCV: 95 fL (ref 80.0–100.0)
Platelets: 167 K/uL (ref 150–400)
RBC: 3.43 MIL/uL — ABNORMAL LOW (ref 3.87–5.11)
RDW: 13.5 % (ref 11.5–15.5)
WBC: 11.8 K/uL — ABNORMAL HIGH (ref 4.0–10.5)
nRBC: 0 % (ref 0.0–0.2)

## 2019-06-21 NOTE — Progress Notes (Signed)
Subjective: POD# 1 Information for the patient's newborn:  Ilisha, Blust [[id number]]  female     Reports feeling tired and sore Feeding: breast Reports tolerating PO and denies N/V, foley removed, ambulating and urinating w/o difficulty  Pain controlled with PO meds Denies HA/SOB/dizziness  Flatus not passing Vaginal bleeding is normal, no clots     Objective:  VS:  Vitals:   06/20/19 1800 06/20/19 2134 06/21/19 0100 06/21/19 0522  BP: 96/64  (!) 90/55 (!) 92/58  Pulse: 63  (!) 53 (!) 52  Resp: 18  20 20   Temp: 97.7 F (36.5 C) 97.9 F (36.6 C) 98.1 F (36.7 C) 98.3 F (36.8 C)  TempSrc: Axillary Oral Oral Oral  SpO2: 100% 100% 100% 100%  Weight:      Height:        Intake/Output Summary (Last 24 hours) at 06/21/2019 0943 Last data filed at 06/21/2019 0600 Gross per 24 hour  Intake 4291.95 ml  Output 3205 ml  Net 1086.95 ml     Recent Labs    06/18/19 1024 06/21/19 0544  WBC 6.8 11.8*  HGB 13.0 10.8*  HCT 39.7 32.6*  PLT 196 167    Blood type: --/--/A POS, A POS Performed at [facility], [address] [place], [place]  (02/23 1024) Rubella: Immune (09/08 0000)    Physical Exam:  General: alert, cooperative and no distress CV: Regular rate and rhythm Resp: clear Abdomen: soft, nontender,hypoactive bowel sounds Incision: serosangeunous drainage present Perineum:  Uterine Fundus: firm, below umbilicus, nontender Lochia: appropriate Ext: no edema, redness, cords, or tenderness in the calves or thighs   Assessment/Plan: 39 y.o.   POD# 1. 30                  Active Problems:   Status post repeat low transverse cesarean section   Routine post-op PP care          Advance diet as tolerated Advised warm fluids and ambulation to improve GI motility Encourage rest when baby rests Breastfeeding support   [id number], MSN, CNM 06/21/2019, 9:43 AM

## 2019-06-21 NOTE — Lactation Note (Addendum)
This note was copied from a baby's chart. Lactation Consultation Note  Patient Name: Marcia Hobbs Today's Date: 06/21/2019   Infant is 69 hrs old & is being formula fed. Mom is pumping somewhat (unknown frequency, but it sounds as if she gives up after 5 minutes). Mom said she did not make a lot of milk with her 1st child. I suggested that Mom attempt to express her milk every time infant receives formula. Hand expression was done with Mom on her R breast. No colostrum was noted.   Dad said that Mom was hungry & had not eaten since 0600 & needed something to drink (water that was neither too hot nor too cold). I obtained some cups, (per their request) & got some hot water & asked them to let it cool down. Mom already had a pitcher of water at room temperature. I let the RN know that Mom was hungry & had not eaten.  I attempted to explain the importance of stimulating one's breasts to facilitate milk coming to volume/maximizing potential supply, but parents, in essence, said that her milk would not come in until she was being provided the foods and drink that she wanted (visitors are not allowed during this pandemic time).  RN came into room to show Dad how to order food.   Even though FOB has signed the interpreter form, one may want to use a (female) [employer] interpreter for further communication.   The nipple being used for bottle feedings is the Enfamil Extra-Slow flow nipple. I provided additional ones.   [name] [facility] 06/21/2019, 3:03 PM

## 2019-06-22 MED ORDER — IBUPROFEN 800 MG PO TABS: 800.0000 mg | ORAL_TABLET | Freq: Four times a day (QID) | ORAL | 0 refills | Status: DC

## 2019-06-22 MED ORDER — OXYCODONE HCL 5 MG PO TABS: 5.0000 mg | ORAL_TABLET | ORAL | 0 refills | Status: DC | PRN

## 2019-06-22 NOTE — Discharge Summary (Signed)
[facility] OB Discharge Summary     Patient Name: Marcia Hobbs DOB: [birth year] MRN: [id number]  Date of admission: 06/20/2019 Delivering MD: Osborn Coho  Date of delivery: 06/20/2019 Type of delivery: repeat LTCS  Newborn Data: Sex: Baby female  Live born female  Birth Weight: 5 lb 13.5 oz (2650 g) APGAR: 9, 9  Newborn Delivery   Birth date/time: 06/20/2019 12:52:00 Delivery type: C-Section, Low Transverse Trial of labor: No C-section categorization: Repeat      Feeding: breast and bottle Infant being discharge to home with mother in stable condition.   Admitting diagnosis: Status post repeat low transverse cesarean section [Z98.891] Intrauterine pregnancy: [redacted]w[redacted]d     Secondary diagnosis:  Active Problems:   Status post repeat low transverse cesarean section   Normal postpartum course                                Complications: None                                                              Intrapartum Procedures: cesarean: low cervical, transverse Postpartum Procedures: none Complications-Operative and Postpartum: none Augmentation: AROM and @delivery    History of Present Illness: [name] is a 39 y.o. female, G2P2002, who presents at [redacted]w[redacted]d weeks gestation. The patient has [facility] followed at  [facility]  Her pregnancy has been complicated by:  Patient Active Problem List   Diagnosis Date Noted  . Normal postpartum course 06/22/2019  . Status post repeat low transverse cesarean section 06/20/2019  . Advanced maternal age in multigravida, second trimester   . [redacted] weeks gestation of pregnancy     [facility] course:  Sceduled C/S   39 y.o. yo G2P2002 at [redacted]w[redacted]d was admitted to the [facility] 06/20/2019 for scheduled cesarean section with the following indication:Elective Repeat.  Membrane Rupture Time/Date: 12:52 PM ,06/20/2019   Patient delivered a Viable infant.06/20/2019  Details of operation can be found in  separate operative note.  Pateint had an uncomplicated postpartum course.  She is ambulating, tolerating a regular diet, passing flatus, and urinating well. Patient is discharged home in stable condition on  06/22/19  [facility] Course--Scheduled Cesarean: Patient was admitted on 06/20/2019 for a scheduled Repeat cesarean delivery.   She was taken to the operating room, where Dr. 06/22/2019 performed a repeat LTCS under spinal anesthesia, with delivery of a viable baby female, with weight and Apgars as listed below. Infant was in good condition and remained at the patient's bedside.  The patient was taken to recovery in good condition.  Patient planned to breast and bottle feed.  On post-op day 1, patient was doing well, tolerating a regular diet, with Hgb of 10.3.  Throughout her stay, her physical exam was WNL, her incision was CDI, and her vital signs remained stable.  By post-op day 1, she was up ad lib, tolerating a regular diet, with good pain control with po med.  She was deemed to have received the full benefit of her [facility] stay, and was discharged home in stable condition.  Contraceptive choice was declined.    Physical exam  Vitals:   06/21/19 0930 06/21/19 1330 06/21/19 2002 06/22/19 0553  BP:  102/62 96/63 (!) 97/56 (!) 97/58  Pulse: 61 (!) 54 69 (!) 59  Resp: 18 20 18 20   Temp: 98.2 F (36.8 C) 98.7 F (37.1 C) 98.3 F (36.8 C) 97.8 F (36.6 C)  TempSrc: Oral Oral  Oral  SpO2:   100% 100%  Weight:      Height:       General: alert, cooperative and no distress Lochia: appropriate Uterine Fundus: firm Incision: Healing well with no significant drainage, No significant erythema, Dressing is clean, dry, and intact, honeycomb dressing CDI Perineum: Intact DVT Evaluation: No evidence of DVT seen on physical exam. Negative Homan's sign. No cords or calf tenderness. No significant calf/ankle edema.  Labs: Lab Results  Component Value Date   WBC 11.8 (H) 06/21/2019   HGB 10.8 (L)  06/21/2019   HCT 32.6 (L) 06/21/2019   MCV 95.0 06/21/2019   PLT 167 06/21/2019   CMP Latest Ref Rng & Units 05/02/2018  Glucose 65 - 99 mg/dL 89  BUN 6 - 20 mg/dL 10  Creatinine 0.57 - 1.00 mg/dL 0.59  Sodium 134 - 144 mmol/L 135  Potassium 3.5 - 5.2 mmol/L 3.9  Chloride 96 - 106 mmol/L 100  CO2 20 - 29 mmol/L 22  Calcium 8.7 - 10.2 mg/dL 9.4    Date of discharge: 06/22/2019 Discharge Diagnoses: Term Pregnancy-delivered Discharge instruction: per After Visit Summary and "Baby and Me Booklet".  After visit meds:   Activity:           unrestricted and pelvic rest Advance as tolerated. Pelvic rest for 6 weeks.  Diet:                routine Medications: PNV, Ibuprofen and oxy IR Postpartum contraception: Natural Family Planning Condition:  Pt discharge to home with baby in stable  Meds: Allergies as of 06/22/2019   No Known Allergies     Medication List    TAKE these medications   ibuprofen 800 MG tablet Commonly known as: ADVIL Take 1 tablet (800 mg total) by mouth every 6 (six) hours.   oxyCODONE 5 MG immediate release tablet Commonly known as: Oxy IR/ROXICODONE Take 1-2 tablets (5-10 mg total) by mouth every 4 (four) hours as needed for moderate pain.   PRENATAL VITAMIN PO Take by mouth.            Discharge Care Instructions  (From admission, onward)         Start     Ordered   06/22/19 0000  Discharge wound care:    Comments: Take dressing off on day 5-7 postpartum.  Report increased drainage, redness or warmth. Clean with water, let soap trickle down body. Can leave steri strips on until they fall off or take them off gently at day 10. Keep open to air, clean and dry.   06/22/19 1109          Discharge Follow Up:  Follow-up [facility]. Schedule an appointment as soon as possible for a visit in 6 week(s).   Specialty: Obstetrics and Gynecology Contact information: [address] [place] [id number] [place], NP-C, CNM 06/22/2019, 11:10 AM  Noralyn Pick, [place]

## 2019-06-24 MED FILL — IBUPROFEN 800 MG TABLET: 800 | 7 days supply | Qty: 30 | Fill #0

## 2020-08-20 ENCOUNTER — Ambulatory Visit: Payer: Medicaid Other | Admitting: Internal Medicine

## 2020-08-20 ENCOUNTER — Other Ambulatory Visit: Payer: Self-pay

## 2020-08-20 ENCOUNTER — Encounter: Payer: Self-pay | Admitting: Internal Medicine

## 2020-08-20 VITALS — BP 122/84 | HR 73 | Resp 16 | Ht 63.0 in | Wt 171.2 lb

## 2020-08-20 DIAGNOSIS — N921 Excessive and frequent menstruation with irregular cycle: Secondary | ICD-10-CM

## 2020-08-20 MED ORDER — NORETHIN ACE-ETH ESTRAD-FE 1-20 MG-MCG(24) PO TABS: 1.0000 | ORAL_TABLET | Freq: Every day | ORAL | 2 refills | Status: DC

## 2020-08-20 NOTE — Progress Notes (Signed)
  Subjective:    [facility] - 40 y.o. female MRN [id number]  Date of birth: 1980-08-22  HPI  [facility] is here for concerns about her menstrual cycle. She reports that menstrual cycle has been occurring since 4/1 and is heavy. Gave birth in Feb 2021. Reports that menstrual cycle has been irregular since January. She did start having menstrual cycles about 40 days after giving birth. She is not on any type of contraception. She is still breastfeeding but reports she has recently decreased her amount/frequency of breastfeeding. No lightheadedness or dizziness. No syncopal episodes. No history of VTE.    Health Maintenance:  Health Maintenance Due  Topic Date Due  . Hepatitis C Screening  Never done  . COVID-19 Vaccine (1) Never done  . TETANUS/TDAP  Never done  . PAP SMEAR-Modifier  Never done    -  reports that she has never smoked. She has never used smokeless tobacco. - Review of Systems: Per HPI. - Past Medical History: Patient Active Problem List   Diagnosis Date Noted  . Normal postpartum course 06/22/2019  . Status post repeat low transverse cesarean section 06/20/2019  . Advanced maternal age in multigravida, second trimester   . [redacted] weeks gestation of pregnancy    - Medications: reviewed and updated   Objective:   Physical Exam BP 122/84   Pulse 73   Resp 16   Ht 5\' 3"  (1.6 m)   Wt 171 lb 3.2 oz (77.7 kg)   LMP 07/24/2020   SpO2 99%   BMI 30.33 kg/m  Physical Exam Constitutional:      General: She is not in acute distress.    Appearance: She is not diaphoretic.  Cardiovascular:     Rate and Rhythm: Normal rate.  Pulmonary:     Effort: Pulmonary effort is normal. No respiratory distress.  Musculoskeletal:        General: Normal range of motion.  Skin:    General: Skin is warm and dry.  Neurological:     Mental Status: She is alert and oriented to person, place, and time.  Psychiatric:        Mood and Affect: Affect normal.         Judgment: Judgment normal.       Assessment & Plan:   1. Menorrhagia with irregular cycle Given irregularity is new, suspect related to change in hormonal balance from weaning breastfeeding. Will check CBC given report of bleeding being heavy and for prolonged bleeding for 4 weeks. Will prescribe OCP x3 months to reset menstrual cycle. Patient has no history of VTE, migraine headaches with aura, HTN, or smoking history. If menstrual cycle does not regulate with OCP, would recommend further work up.  - Norethindrone Acetate-Ethinyl Estrad-FE (LOESTRIN 24 FE) 1-20 MG-MCG(24) tablet; Take 1 tablet by mouth daily.  Dispense: 28 tablet; Refill: 2 - CBC   09/23/2020, D.O. 08/20/2020, 9:13 AM Primary Care at [facility]

## 2020-08-20 NOTE — Progress Notes (Signed)
Pt states her period started April first and she is still currently on her menstrual

## 2020-08-21 LAB — CBC
Hematocrit: 34.4 % (ref 34.0–46.6)
Hemoglobin: 11.3 g/dL (ref 11.1–15.9)
MCH: 30 pg (ref 26.6–33.0)
MCHC: 32.8 g/dL (ref 31.5–35.7)
MCV: 91 fL (ref 79–97)
Platelets: 165 10*3/uL (ref 150–450)
RBC: 3.77 x10E6/uL (ref 3.77–5.28)
RDW: 13.2 % (ref 11.7–15.4)
WBC: 4.5 10*3/uL (ref 3.4–10.8)

## 2020-08-27 ENCOUNTER — Encounter: Payer: Self-pay | Admitting: *Deleted

## 2020-11-12 ENCOUNTER — Other Ambulatory Visit: Payer: Self-pay

## 2020-11-12 DIAGNOSIS — N921 Excessive and frequent menstruation with irregular cycle: Secondary | ICD-10-CM

## 2020-11-12 MED ORDER — NORETHIN ACE-ETH ESTRAD-FE 1-20 MG-MCG(24) PO TABS: 1.0000 | ORAL_TABLET | Freq: Every day | ORAL | 4 refills | Status: DC

## 2020-11-15 IMAGING — US US MFM OB DETAIL+14 WK
1 series · 12 of 28 positions shown · non-contrast
Comparison: none

[Series 1: us mfm ob detail+14 wk · 12 of 117 slices shown]
[im 5/117]
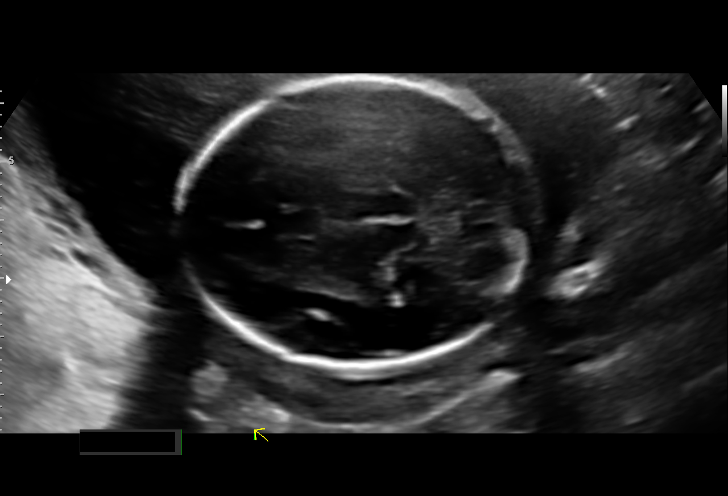
[im 13/117]
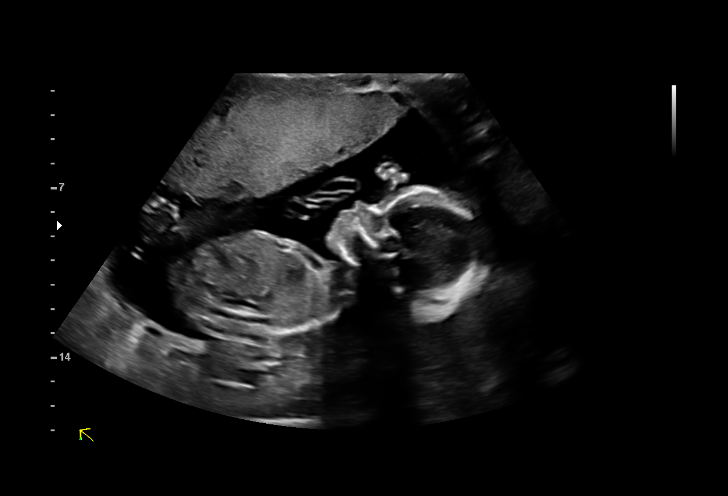
[im 22/117]
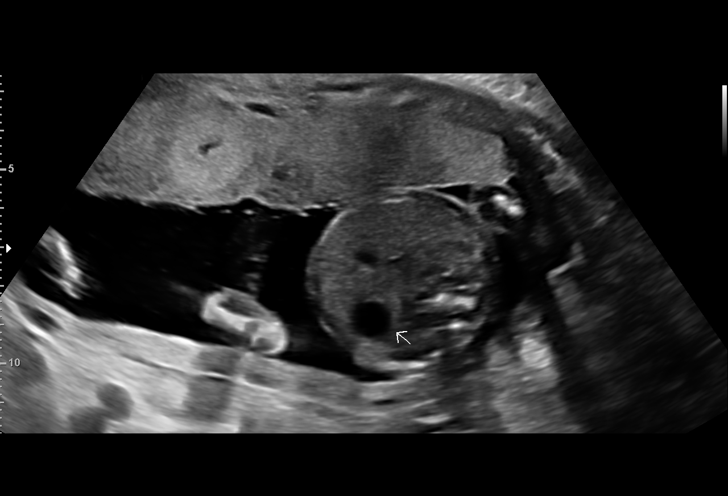
[im 35/117]
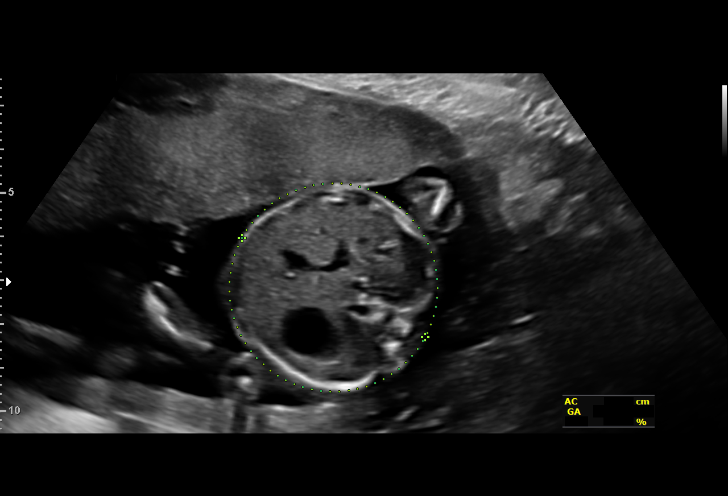
[im 43/117]
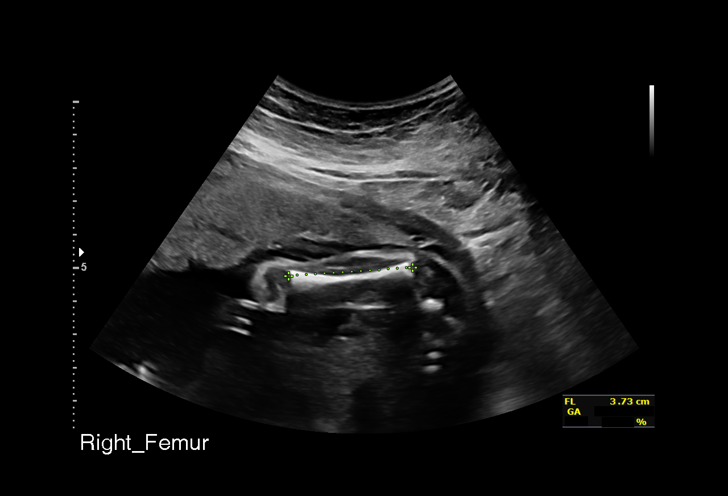
[im 52/117]
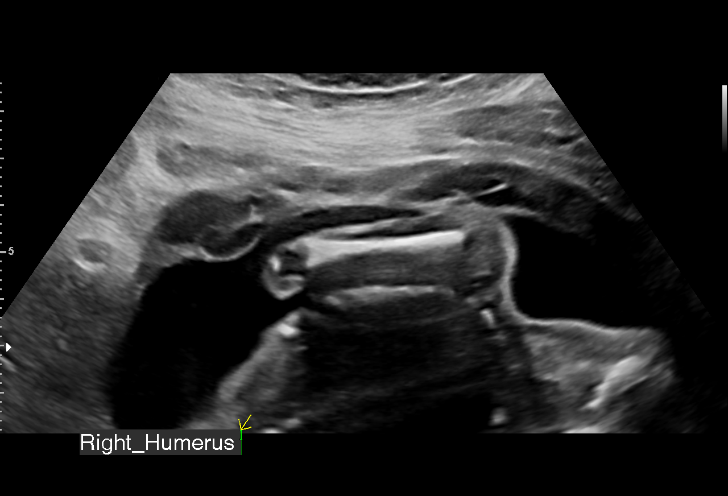
[im 65/117]
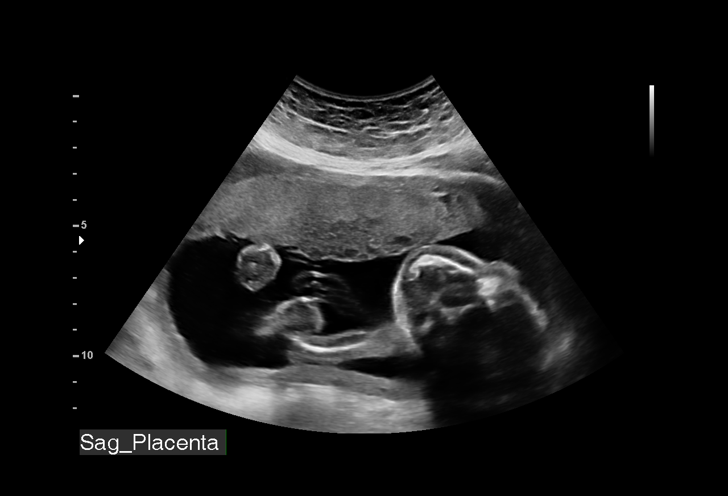
[im 74/117]
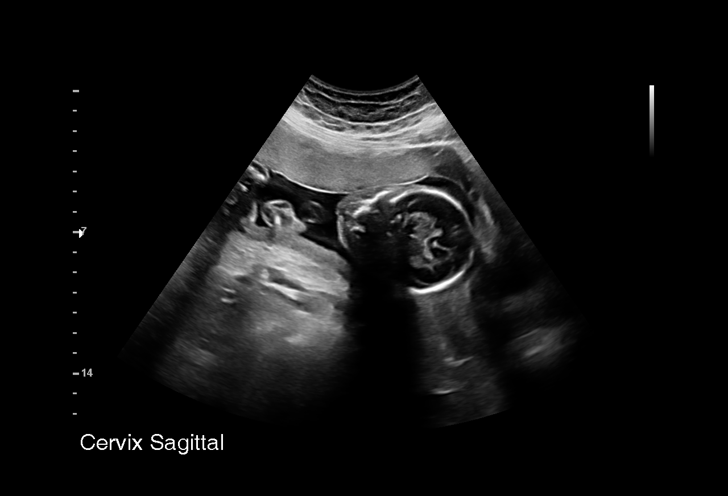
[im 82/117]
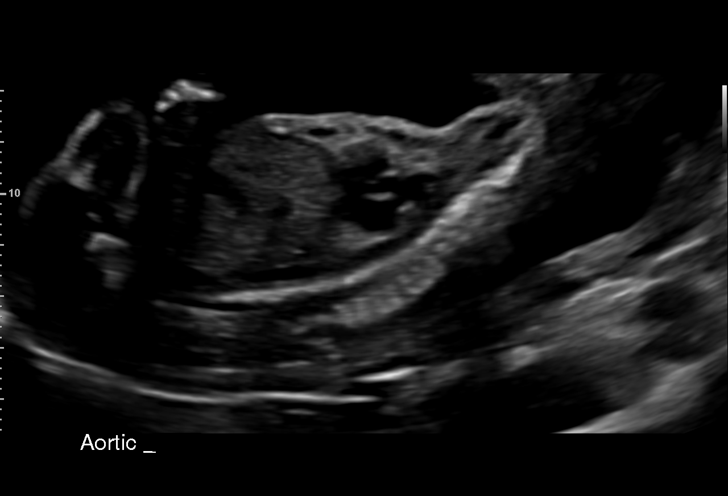
[im 95/117]
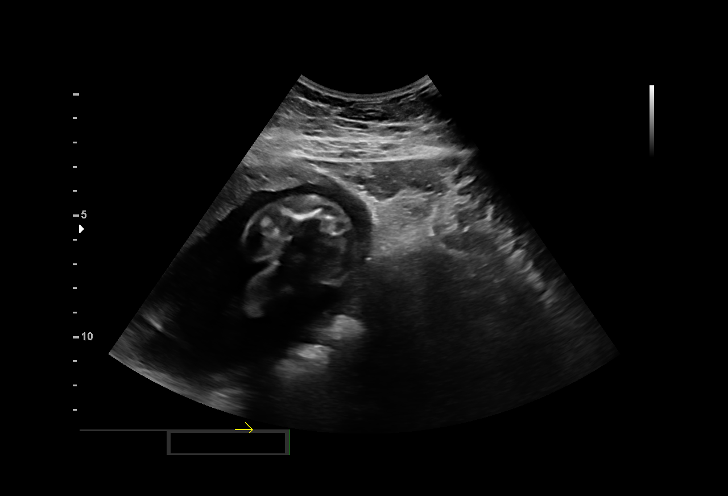
[im 104/117]
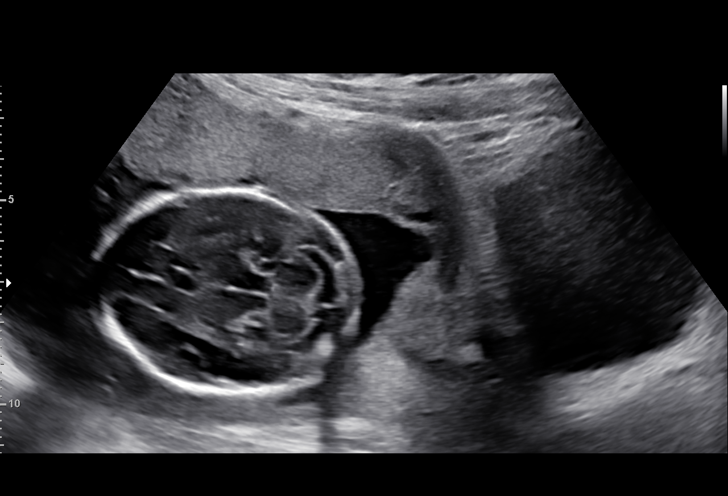
[im 112/117]
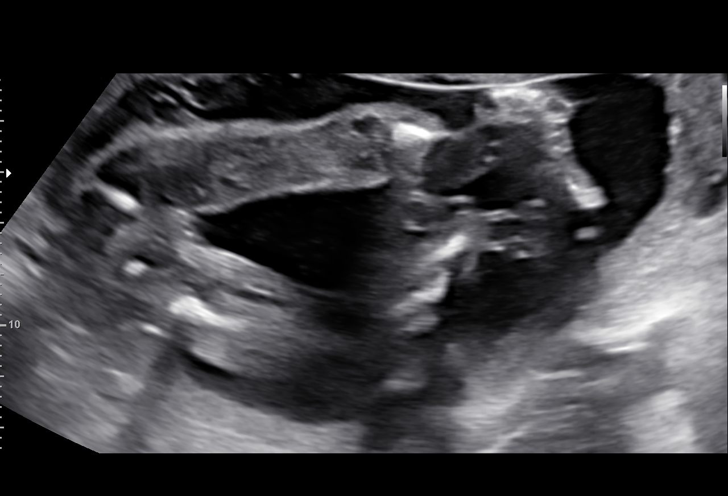

[12 of 28 positions shown; findings below may reference images not displayed]

Obstetrics &
                                                            Gynecology
                                                            [address]
                                                            [place].
                   CNM

 ----------------------------------------------------------------------

 ----------------------------------------------------------------------
Indications

  Advanced maternal age multigravida 35+,
  second trimester
  History of cesarean delivery, currently
  pregnant
  Late prenatal care, second trimester
  Encounter for antenatal screening for
  malformations
  21 weeks gestation of pregnancy
 ----------------------------------------------------------------------
Fetal Evaluation

 Num Of Fetuses:         1
 Fetal Heart Rate(bpm):  158
 Cardiac Activity:       Observed
 Presentation:           Cephalic
 Placenta:               Anterior
 P. Cord Insertion:      Visualized

 Amniotic Fluid
 AFI FV:      Within normal limits

                             Largest Pocket(cm)

Biometry

 BPD:      49.5  mm     G. Age:  21w 0d         42  %    CI:        76.05   %    70 - 86
                                                         FL/HC:      20.2   %    15.9 -
 HC:      179.9  mm     G. Age:  20w 3d         13  %    HC/AC:      1.19        1.06 -
 AC:      151.6  mm     G. Age:  20w 3d         20  %    FL/BPD:     73.3   %
 FL:       36.3  mm     G. Age:  21w 4d         54  %    FL/AC:      23.9   %    20 - 24
 HUM:      33.5  mm     G. Age:  21w 2d         52  %
 CER:      22.7  mm     G. Age:  21w 2d         56  %
 LV:        6.4  mm
 CM:          5  mm

 Est. FW:     382  gm    0 lb 13 oz      30  %
OB History

 Gravidity:    2         Term:   1
 Living:       1
Gestational Age

 LMP:           22w 6d        Date:  09/08/18                 EDD:   06/15/19
 U/S Today:     20w 6d                                        EDD:   06/29/19
 Best:          21w 1d     Det. By:  Early Ultrasound         EDD:   06/27/19
                                     (01/29/19)
Anatomy

 Cranium:               Appears normal         LVOT:                   Appears normal
 Cavum:                 Appears normal         Aortic Arch:            Appears normal
 Ventricles:            Appears normal         Ductal Arch:            Appears normal
 Choroid Plexus:        Appears normal         Diaphragm:              Appears normal
 Cerebellum:            Appears normal         Stomach:                Appears normal, left
                                                                       sided
 Posterior Fossa:       Appears normal         Abdomen:                Appears normal
 Nuchal Fold:           Not applicable (>20    Abdominal Wall:         Appears nml (cord
                        wks GA)                                        insert, abd wall)
 Face:                  Appears normal         Cord Vessels:           Appears normal (3
                        (orbits and profile)                           vessel cord)
 Lips:                  Appears normal         Kidneys:                Appear normal
 Palate:                Not well visualized    Bladder:                Appears normal
 Thoracic:              Appears normal         Spine:                  Limited views
                                                                       appear normal
 Heart:                 Appears normal         Upper Extremities:      Appears normal
                        (4CH, axis, and
                        situs)
 RVOT:                  Appears normal         Lower Extremities:      Appears normal

 Other:  Heels and 5th digit visualized. Open hands visualized. Nasal bone
         visualized. Technically difficult due to fetal position and movement.
Cervix Uterus Adnexa

 Cervix
 Length:           3.07  cm.
 Normal appearance by transabdominal scan.

 Uterus
 No abnormality visualized.

 Left Ovary
 Size(cm)       2.4  x   1.5    x  1.9       Vol(ml):
 Within normal limits.
 Right Ovary
 Not visualized.
Comments

 This patient was seen in consultation for a detailed fetal
 anatomy scan due to advanced maternal age and to confirm
 her dates.  The patient presented late for prenatal care.  She
 is uncertain of her last menstrual period as she has irregular
 cycles after she stopped breast-feeding. She denies any
 other significant past medical history and denies any
 problems in her current pregnancy.
 The patient reports that she had a blood test earlier in her
 pregnancy which revealed a low risk for trisomy 21, 18, and
 13.  The results of that test was unavailable today.
 The fetal biometry measurements obtained today are
 consistent with an EDC June 27, 2019 (the same EDC that
 was obtained from the ultrasound performed in your office).
 The patient was advised that as she did not have a first
 trimester ultrasound performed in her current pregnancy, that
 this EDC would be our best estimate.  June 27, 2019 should
 be used as her final EDC.  Due to her uncertain dates, the
 patient should continue to be followed with serial growth
 ultrasounds.  These ultrasounds may be performed in your
 office or ours.
 There were no obvious fetal anomalies noted on today's
 ultrasound exam.
 The patient was informed that anomalies may be missed due
 to technical limitations. If the fetus is in a suboptimal position
 or maternal habitus is increased, visualization of the fetus in
 the maternal uterus may be impaired.
 The increased risk of fetal aneuploidy due to advanced
 maternal age was discussed. Due to advanced maternal age,
 the patient was offered and declined an amniocentesis today
 for definitive diagnosis of fetal aneuploidy.
 No further exams were scheduled in our office.  We would be
 happy to see her again in the future should you feel that it is
 indicated.
 A total of 30 minutes was spent counseling and coordinating
 the care for this patient.  Greater than 50% of the time was
 spent in direct face-to-face contact.

## 2022-08-21 ENCOUNTER — Encounter (HOSPITAL_COMMUNITY): Payer: Self-pay

## 2022-08-21 ENCOUNTER — Inpatient Hospital Stay (HOSPITAL_COMMUNITY)
Admission: AD | Admit: 2022-08-21 | Discharge: 2022-08-21 | Disposition: A | Discharge status: Home / Self Care | Payer: Medicaid Other | Attending: Obstetrics & Gynecology | Admitting: Obstetrics & Gynecology

## 2022-08-21 ENCOUNTER — Inpatient Hospital Stay (HOSPITAL_COMMUNITY): Payer: Medicaid Other

## 2022-08-21 ENCOUNTER — Other Ambulatory Visit: Payer: Self-pay

## 2022-08-21 DIAGNOSIS — O200 Threatened abortion: Secondary | ICD-10-CM | POA: Diagnosis not present

## 2022-08-21 DIAGNOSIS — N939 Abnormal uterine and vaginal bleeding, unspecified: Secondary | ICD-10-CM | POA: Diagnosis present

## 2022-08-21 DIAGNOSIS — Z3A10 10 weeks gestation of pregnancy: Secondary | ICD-10-CM | POA: Diagnosis not present

## 2022-08-21 LAB — CBC
HCT: 38.2 % (ref 36.0–46.0)
Hemoglobin: 12.2 g/dL (ref 12.0–15.0)
MCH: 29.8 pg (ref 26.0–34.0)
MCHC: 31.9 g/dL (ref 30.0–36.0)
MCV: 93.4 fL (ref 80.0–100.0)
Platelets: 245 10*3/uL (ref 150–400)
RBC: 4.09 MIL/uL (ref 3.87–5.11)
RDW: 14.4 % (ref 11.5–15.5)
WBC: 4.5 10*3/uL (ref 4.0–10.5)
nRBC: 0 % (ref 0.0–0.2)

## 2022-08-21 LAB — POCT PREGNANCY, URINE: Preg Test, Ur: POSITIVE — AB

## 2022-08-21 LAB — URINALYSIS, ROUTINE W REFLEX MICROSCOPIC
Bilirubin Urine: NEGATIVE
Glucose, UA: NEGATIVE mg/dL
Ketones, ur: NEGATIVE mg/dL
Leukocytes,Ua: NEGATIVE
Nitrite: NEGATIVE
Protein, ur: NEGATIVE mg/dL
Specific Gravity, Urine: 1.013 (ref 1.005–1.030)
pH: 6 (ref 5.0–8.0)

## 2022-08-21 LAB — HCG, QUANTITATIVE, PREGNANCY: hCG, Beta Chain, Quant, S: 793 m[IU]/mL — ABNORMAL HIGH (ref <5)

## 2022-08-21 NOTE — MAU Provider Note (Signed)
History     [id number]  Arrival date and time: 08/21/22 1424    Chief Complaint  Patient presents with   Back Pain   Vaginal Bleeding     HPI Marcia Hobbs is a 42 y.o. [id number] at [redacted]w[redacted]d by LMP who presents for vaginal bleeding. Bleeding started this morning. Reports dark red blood in underwear. Not saturating pads or passing blood clots. Reports abdominal cramping as well. Has first ob appointment with [facility] in 2 weeks. Had first positive pregnancy test on 4/17. Very concerned due to miscarriage last year.     OB History     Gravida  4   Para  2   Term  2   Preterm      AB  1   Living  2      SAB  1   IAB      Ectopic      Multiple  0   Live Births  1           Past Medical History:  Diagnosis Date   Medical history non-contributory     Past Surgical History:  Procedure Laterality Date   CESAREAN SECTION     CESAREAN SECTION N/A 06/20/2019   Procedure: REPEAT CESAREAN SECTION;  Surgeon: Osborn Coho, MD;  Location: [facility];  Service: Obstetrics;  Laterality: N/A;    No family history on file.  Social History   Socioeconomic History   Marital status: Married    Spouse name: Not on file   Number of children: Not on file   Years of education: Not on file   Highest education level: Not on file  Occupational History   Not on file  Tobacco Use   Smoking status: Never   Smokeless tobacco: Never  Vaping Use   Vaping Use: Never used  Substance and Sexual Activity   Alcohol use: Never   Drug use: Never   Sexual activity: Yes    Birth control/protection: None  Other Topics Concern   Not on file  Social History Narrative   Not on file   Social Determinants of Health   Financial Resource Strain: Not on file  Food Insecurity: Not on file  Transportation Needs: Not on file  Physical Activity: Not on file  Stress: Not on file  Social Connections: Not on file  Intimate Partner Violence: Not on file    No Known  Allergies  No current facility-administered medications on file prior to encounter.   Current Outpatient Medications on File Prior to Encounter  Medication Sig Dispense Refill   Prenatal Vit-Fe Fumarate-FA (PRENATAL VITAMIN PO) Take by mouth. (Patient not taking: Reported on 08/20/2020)       ROS Pertinent positives and negative per HPI, all others reviewed and negative  Physical Exam   BP 102/61   Pulse 71   Temp 98.2 F (36.8 C) (Oral)   Resp 17   LMP 06/09/2022   SpO2 100%   Patient Vitals for the past 24 hrs:  BP Temp Temp src Pulse Resp SpO2  08/21/22 1759 102/61 -- -- 71 17 --  08/21/22 1708 104/64 -- -- 71 -- --  08/21/22 1509 (!) 135/94 98.2 F (36.8 C) Oral 82 16 100 %    Physical Exam Vitals and nursing note reviewed. Exam conducted with a chaperone present.  Constitutional:      General: She is not in acute distress.    Appearance: Normal appearance. She is not ill-appearing.  HENT:  Head: Normocephalic and atraumatic.  Eyes:     General: No scleral icterus.    Pupils: Pupils are equal, round, and reactive to light.  Pulmonary:     Effort: Pulmonary effort is normal. No respiratory distress.  Abdominal:     Tenderness: There is no abdominal tenderness.  Genitourinary:    Comments: Pelvic: Small amount of dark red blood. Cervix closed/pink/smooth. No abnormal discharge.  Skin:    General: Skin is warm and dry.  Neurological:     Mental Status: She is alert.       Labs Results for orders placed or performed during the hospital encounter of 08/21/22 (from the past 24 hour(s))  Pregnancy, urine POC     Status: Abnormal   Collection Time: 08/21/22  3:01 PM  Result Value Ref Range   Preg Test, Ur POSITIVE (A) NEGATIVE  Urinalysis, Routine w reflex microscopic -Urine, Clean Catch     Status: Abnormal   Collection Time: 08/21/22  3:15 PM  Result Value Ref Range   Color, Urine YELLOW YELLOW   APPearance CLEAR CLEAR   Specific Gravity, Urine 1.013  1.005 - 1.030   pH 6.0 5.0 - 8.0   Glucose, UA NEGATIVE NEGATIVE mg/dL   Hgb urine dipstick MODERATE (A) NEGATIVE   Bilirubin Urine NEGATIVE NEGATIVE   Ketones, ur NEGATIVE NEGATIVE mg/dL   Protein, ur NEGATIVE NEGATIVE mg/dL   Nitrite NEGATIVE NEGATIVE   Leukocytes,Ua NEGATIVE NEGATIVE   RBC / HPF 0-5 0 - 5 RBC/hpf   WBC, UA 0-5 0 - 5 WBC/hpf   Bacteria, UA RARE (A) NONE SEEN   Squamous Epithelial / HPF 0-5 0 - 5 /HPF   Mucus PRESENT   CBC     Status: None   Collection Time: 08/21/22  4:06 PM  Result Value Ref Range   WBC 4.5 4.0 - 10.5 K/uL   RBC 4.09 3.87 - 5.11 MIL/uL   Hemoglobin 12.2 12.0 - 15.0 g/dL   HCT 16.1 09.6 - 04.5 %   MCV 93.4 80.0 - 100.0 fL   MCH 29.8 26.0 - 34.0 pg   MCHC 31.9 30.0 - 36.0 g/dL   RDW 40.9 81.1 - 91.4 %   Platelets 245 150 - 400 K/uL   nRBC 0.0 0.0 - 0.2 %  hCG, quantitative, pregnancy     Status: Abnormal   Collection Time: 08/21/22  4:06 PM  Result Value Ref Range   hCG, Beta Chain, Quant, S 793 (H) <5 mIU/mL    Imaging US OB LESS THAN 14 WEEKS WITH OB TRANSVAGINAL  Result Date: 08/21/2022 CLINICAL DATA:  Vaginal bleeding. EXAM: OBSTETRIC <14 WK US OB US TECHNIQUE: Both transabdominal and transvaginal ultrasound examinations were performed for complete evaluation of the gestation as well as the maternal uterus, adnexal regions, and pelvic cul-de-sac. Transvaginal technique was performed to assess early pregnancy. COMPARISON:  None for this pregnancy. FINDINGS: Intrauterine gestational sac: Single Yolk sac:  Not Visualized. Embryo:  Not Visualized. Cardiac Activity: Not Visualized. Heart Rate:   bpm MSD: 7.2 mm   5 w   3 d Subchorionic hemorrhage:  None visualized. Maternal uterus/adnexae: Normal. IMPRESSION: Intrauterine gestational sac with elongated appearance. No embryo or yolk sac seen. Findings are suspicious but not yet definitive for failed pregnancy. Recommend follow-up US in 10-14 days for definitive diagnosis. This recommendation  follows SRU consensus guidelines: Diagnostic Criteria for Nonviable Pregnancy Early in the First Trimester. Malva Limes Med 2013; 782:9562-13. Electronically Signed   By: Ted Mcalpine  M.D.   On: 08/21/2022 17:31    MAU Course  Procedures Lab Orders         Urinalysis, Routine w reflex microscopic -Urine, Clean Catch         CBC         hCG, quantitative, pregnancy         Pregnancy, urine POC    No orders of the defined types were placed in this encounter.  Imaging Orders         US OB LESS THAN 14 WEEKS WITH OB TRANSVAGINAL      MDM moderate  Assessment and Plan   1. Threatened miscarriage   2. [redacted] weeks gestation of pregnancy    -Patient concerned due to vaginal bleeding & hx of prior miscarriage. Small amount of bleeding on exam & cervix closed. She is RH positive. Ultrasound shows small IUGS & HCG today is 793. She is 10 wks based on LMP but didn't have positive pregnancy test until 10 days ago. Suspect that she is earlier than 10 weeks but can't rule out miscarriage. Will get f/u HCG in a few days to see if it's dropping - otherwise she has f/u in 2 weeks & can have viability scan at that time. Spoke with [facility] CNM [facility]) to have patient scheduled for f/u lab this week.  -Reviewed bleeding/SAB precautions with patient -Video Arabic interpreter used for this encounter    Dispo: discharged to home in stable condition.   Discharge Instructions     Discharge patient   Complete by: As directed    Discharge disposition: 01-Home or Self Care   Discharge patient date: 08/21/2022       Judeth Horn, NP 08/21/22 7:40 PM  Allergies as of 08/21/2022   No Known Allergies      Medication List     STOP taking these medications    ibuprofen 800 MG tablet Commonly known as: ADVIL   Norethindrone Acetate-Ethinyl Estrad-FE 1-20 MG-MCG(24) tablet Commonly known as: LOESTRIN 24 FE   oxyCODONE 5 MG immediate release tablet Commonly known as: Oxy IR/ROXICODONE        TAKE these medications    PRENATAL VITAMIN PO Take by mouth.

## 2022-08-21 NOTE — Discharge Instructions (Signed)
Return to care  If you have heavier bleeding that soaks through more than 2 pads per hour for an hour or more If you bleed so much that you feel like you might pass out or you do pass out If you have significant abdominal pain that is not improved with Tylenol   

## 2022-08-21 NOTE — MAU Note (Addendum)
.  Marcia Hobbs is a 42 y.o. at [redacted]w[redacted]d here in MAU reporting: lower back pain and vag bleeding that started as spotting last night.  Pt reports bleeding has gotten heavier today. Last intercourse 2 days ago  LMP: 2/15 Onset of complaint: 4/27 Pain score: 7/10 Vitals:   08/21/22 1509  BP: (!) 135/94  Pulse: 82  Resp: 16  Temp: 98.2 F (36.8 C)  SpO2: 100%   Lab orders placed from triage:   ua

## 2023-12-30 ENCOUNTER — Ambulatory Visit (HOSPITAL_COMMUNITY)
Admission: EM | Admit: 2023-12-30 | Discharge: 2023-12-30 | Disposition: A | Discharge status: Home / Self Care | Attending: Family Medicine | Admitting: Family Medicine

## 2023-12-30 DIAGNOSIS — Z3202 Encounter for pregnancy test, result negative: Secondary | ICD-10-CM | POA: Diagnosis not present

## 2023-12-30 DIAGNOSIS — S39012A Strain of muscle, fascia and tendon of lower back, initial encounter: Secondary | ICD-10-CM | POA: Diagnosis not present

## 2023-12-30 LAB — POCT URINALYSIS DIP (MANUAL ENTRY)
Bilirubin, UA: NEGATIVE
Glucose, UA: NEGATIVE mg/dL
Ketones, POC UA: NEGATIVE mg/dL
Leukocytes, UA: NEGATIVE
Nitrite, UA: NEGATIVE
Protein Ur, POC: NEGATIVE mg/dL
Spec Grav, UA: 1.01 (ref 1.010–1.025)
Urobilinogen, UA: 0.2 U/dL
pH, UA: 5.5 (ref 5.0–8.0)

## 2023-12-30 LAB — POCT URINE PREGNANCY: Preg Test, Ur: NEGATIVE

## 2023-12-30 MED ORDER — PREDNISONE 20 MG PO TABS
40.0000 mg | ORAL_TABLET | Freq: Every day | ORAL | 0 refills | Status: AC
  Filled 2023-12-30: qty 10, 5d supply, fill #0

## 2023-12-30 MED ORDER — CYCLOBENZAPRINE HCL 10 MG PO TABS
10.0000 mg | ORAL_TABLET | Freq: Two times a day (BID) | ORAL | 0 refills | Status: DC | PRN
  Filled 2023-12-30: qty 20, 10d supply, fill #0

## 2023-12-30 NOTE — Discharge Instructions (Signed)
 Take muscle relaxer as needed for low back and hip muscle spasm. Take prednisone  once daily as prescribed. Recommend ice to affected area and gentle stretching. If no improvement or symptoms become worse please return here for evaluation.  ????? ????? ??????? ??? ?????? ????? ????? ???? ????? ??????. ????? ????????? ??? ????? ?????? ??? ?????? ??????. ????? ???? ????? ??? ??????? ??????? ??????? ????. ??? ?? ???? ?? ???? ?? ???? ???????? ????? ?????? ??? ??? ???????. tanawal murkhy aleadalat hasab alhajat litashnij eadalat 'asfal alzuhr walwarki. tanawal biridnizun maratan wahidatan ywmyan hasab alwasfat altibiyiti. yunsh biwade althalj ealaa almintaqat almusabat waltamadud birifqi. 'iidha lam yahduth 'ayu tahasun 'aw sa'at al'aeradi, yurja aleawdat 'iilaa huna liltaqyimi.

## 2023-12-30 NOTE — ED Triage Notes (Signed)
 Patient presents with left side lower abdominal pain that started last night. Patient denies any recent trauma or falls. Pian 07/10

## 2023-12-30 NOTE — ED Provider Notes (Signed)
 [facility]    CSN: [id number] Arrival date & time: 12/30/23  1300      History   Chief Complaint Chief Complaint  Patient presents with   Flank Pain    HPI Marcia Hobbs is a 43 y.o. female.   Patient presents with left lower back pain into the left hip that started last night.  Husband reports she did lift some heavy objects yesterday.  Patient reports pain is worse with movement and standing.  Denies radiation of pain denies numbness, tingling.  She has taken some over-the-counter pain relief medications with improvement.  She denies dysuria, hematuria, abdominal pain.  No previous back problems.    Past Medical History:  Diagnosis Date   Medical history non-contributory     Patient Active Problem List   Diagnosis Date Noted   Normal postpartum course 06/22/2019   Status post repeat low transverse cesarean section 06/20/2019   Advanced maternal age in multigravida, second trimester    [redacted] weeks gestation of pregnancy     Past Surgical History:  Procedure Laterality Date   CESAREAN SECTION     CESAREAN SECTION N/A 06/20/2019   Procedure: REPEAT CESAREAN SECTION;  Surgeon: Henry Slough, MD;  Location: [facility];  Service: Obstetrics;  Laterality: N/A;    OB History     Gravida  4   Para  2   Term  2   Preterm      AB  1   Living  2      SAB  1   IAB      Ectopic      Multiple  0   Live Births  1            Home Medications    Prior to Admission medications   Medication Sig Start Date End Date Taking? Authorizing Provider  cyclobenzaprine  (FLEXERIL ) 10 MG tablet Take 1 tablet (10 mg total) by mouth 2 (two) times daily as needed for muscle spasms. 12/30/23  Yes Ward, Harlene PEDLAR, PA-C  predniSONE  (DELTASONE ) 20 MG tablet Take 2 tablets (40 mg total) by mouth daily for 5 days. 12/30/23 01/04/24 Yes Ward, Harlene PEDLAR, PA-C  Prenatal Vit-Fe Fumarate-FA (PRENATAL VITAMIN PO) Take by mouth. Patient not taking: Reported on  08/20/2020    [provider]    Family History No family history on file.  Social History Social History   Tobacco Use   Smoking status: Never   Smokeless tobacco: Never  Vaping Use   Vaping status: Never Used  Substance Use Topics   Alcohol use: Never   Drug use: Never     Allergies   Patient has no known allergies.   Review of Systems Review of Systems  Constitutional:  Negative for chills and fever.  HENT:  Negative for ear pain and sore throat.   Eyes:  Negative for pain and visual disturbance.  Respiratory:  Negative for cough and shortness of breath.   Cardiovascular:  Negative for chest pain and palpitations.  Gastrointestinal:  Negative for abdominal pain and vomiting.  Genitourinary:  Negative for dysuria and hematuria.  Musculoskeletal:  Positive for back pain. Negative for arthralgias.  Skin:  Negative for color change and rash.  Neurological:  Negative for seizures and syncope.  All other systems reviewed and are negative.    Physical Exam Triage Vital Signs ED Triage Vitals  Encounter Vitals Group     BP 12/30/23 1509 134/81     Girls Systolic BP  Percentile --      Girls Diastolic BP Percentile --      Boys Systolic BP Percentile --      Boys Diastolic BP Percentile --      Pulse Rate 12/30/23 1509 78     Resp 12/30/23 1509 18     Temp 12/30/23 1509 98.3 F (36.8 C)     Temp Source 12/30/23 1509 Oral     SpO2 12/30/23 1509 98 %     Weight --      Height --      Head Circumference --      Peak Flow --      Pain Score 12/30/23 1513 7     Pain Loc --      Pain Education --      Exclude from Growth Chart --    No data found.  Updated Vital Signs BP 134/81 (BP Location: Left Arm)   Pulse 78   Temp 98.3 F (36.8 C) (Oral)   Resp 18   LMP 12/30/2023   SpO2 98%   Visual Acuity Right Eye Distance:   Left Eye Distance:   Bilateral Distance:    Right Eye Near:   Left Eye Near:    Bilateral Near:     Physical Exam Vitals  and nursing note reviewed.  Constitutional:      General: She is not in acute distress.    Appearance: She is well-developed.  HENT:     Head: Normocephalic and atraumatic.  Eyes:     Conjunctiva/sclera: Conjunctivae normal.  Cardiovascular:     Rate and Rhythm: Normal rate and regular rhythm.     Heart sounds: No murmur heard. Pulmonary:     Effort: Pulmonary effort is normal. No respiratory distress.     Breath sounds: Normal breath sounds.  Abdominal:     Palpations: Abdomen is soft.     Tenderness: There is no abdominal tenderness.  Musculoskeletal:        General: No swelling.     Cervical back: Neck supple.       Back:     Comments: No CVA tenderness.  Skin:    General: Skin is warm and dry.     Capillary Refill: Capillary refill takes less than 2 seconds.  Neurological:     Mental Status: She is alert.  Psychiatric:        Mood and Affect: Mood normal.      UC Treatments / Results  Labs (all labs ordered are listed, but only abnormal results are displayed) Labs Reviewed  POCT URINALYSIS DIP (MANUAL ENTRY) - Abnormal; Notable for the following components:      Result Value   Blood, UA large (*)    All other components within normal limits  POCT URINE PREGNANCY    EKG   Radiology No results found.  Procedures Procedures (including critical care time)  Medications Ordered in UC Medications - No data to display  Initial Impression / Assessment and Plan / UC Course  I have reviewed the triage vital signs and the nursing notes.  Pertinent labs & imaging results that were available during my care of the patient were reviewed by me and considered in my medical decision making (see chart for details).     Will treat as musculoskeletal strain.  No CVA tenderness, UA normal.  Flexeril  and prednisone  prescribed.  Supportive care discussed.  Return precautions discussed. Final Clinical Impressions(s) / UC Diagnoses   Final diagnoses:  Strain of  lumbar  paraspinous muscle, initial encounter     Discharge Instructions      Take muscle relaxer as needed for low back and hip muscle spasm. Take prednisone  once daily as prescribed. Recommend ice to affected area and gentle stretching. If no improvement or symptoms become worse please return here for evaluation.  ????? ????? ??????? ??? ?????? ????? ????? ???? ????? ??????. ????? ????????? ??? ????? ?????? ??? ?????? ??????. ????? ???? ????? ??? ??????? ??????? ??????? ????. ??? ?? ???? ?? ???? ?? ???? ???????? ????? ?????? ??? ??? ???????. tanawal murkhy aleadalat hasab alhajat litashnij eadalat 'asfal alzuhr walwarki. tanawal biridnizun maratan wahidatan ywmyan hasab alwasfat altibiyiti. yunsh biwade althalj ealaa almintaqat almusabat waltamadud birifqi. 'iidha lam yahduth 'ayu tahasun 'aw sa'at al'aeradi, yurja aleawdat 'iilaa huna liltaqyimi.   ED Prescriptions     Medication Sig Dispense Auth. Provider   cyclobenzaprine  (FLEXERIL ) 10 MG tablet Take 1 tablet (10 mg total) by mouth 2 (two) times daily as needed for muscle spasms. 20 tablet Ward, Seldon Barrell Z, PA-C   predniSONE  (DELTASONE ) 20 MG tablet Take 2 tablets (40 mg total) by mouth daily for 5 days. 10 tablet Ward, Agnes Brightbill Z, PA-C      PDMP not reviewed this encounter.   Ward, Harlene PEDLAR, PA-C 12/30/23 [phone]

## 2023-12-31 ENCOUNTER — Other Ambulatory Visit: Payer: Self-pay

## 2024-01-01 ENCOUNTER — Other Ambulatory Visit: Payer: Self-pay

## 2024-01-09 ENCOUNTER — Other Ambulatory Visit: Payer: Self-pay

## 2024-01-20 ENCOUNTER — Other Ambulatory Visit: Payer: Self-pay

## 2024-01-20 ENCOUNTER — Telehealth (HOSPITAL_COMMUNITY): Payer: Self-pay | Admitting: *Deleted

## 2024-01-20 ENCOUNTER — Ambulatory Visit (HOSPITAL_COMMUNITY)
Admission: EM | Admit: 2024-01-20 | Discharge: 2024-01-20 | Disposition: A | Discharge status: Home / Self Care | Attending: Emergency Medicine | Admitting: Emergency Medicine

## 2024-01-20 ENCOUNTER — Encounter (HOSPITAL_COMMUNITY): Payer: Self-pay | Admitting: *Deleted

## 2024-01-20 ENCOUNTER — Ambulatory Visit (INDEPENDENT_AMBULATORY_CARE_PROVIDER_SITE_OTHER)

## 2024-01-20 DIAGNOSIS — M25551 Pain in right hip: Secondary | ICD-10-CM

## 2024-01-20 LAB — POCT URINALYSIS DIP (MANUAL ENTRY)
Bilirubin, UA: NEGATIVE
Blood, UA: NEGATIVE
Glucose, UA: NEGATIVE mg/dL — AB
Ketones, POC UA: NEGATIVE mg/dL
Leukocytes, UA: NEGATIVE
Nitrite, UA: NEGATIVE
Protein Ur, POC: NEGATIVE mg/dL
Spec Grav, UA: 1.01 (ref 1.010–1.025)
Urobilinogen, UA: 0.2 U/dL
pH, UA: 6 (ref 5.0–8.0)

## 2024-01-20 LAB — POCT URINE PREGNANCY: Preg Test, Ur: NEGATIVE

## 2024-01-20 MED ORDER — IBUPROFEN 600 MG PO TABS: 600.0000 mg | ORAL_TABLET | Freq: Four times a day (QID) | ORAL | 0 refills | Status: DC | PRN

## 2024-01-20 MED ORDER — IBUPROFEN 600 MG PO TABS: 600.0000 mg | ORAL_TABLET | Freq: Four times a day (QID) | ORAL | 0 refills | Status: AC | PRN

## 2024-01-20 MED ORDER — IBUPROFEN 600 MG PO TABS
600.0000 mg | ORAL_TABLET | Freq: Four times a day (QID) | ORAL | 0 refills | Status: DC | PRN
  Filled 2024-01-20: qty 30, 8d supply, fill #0

## 2024-01-20 NOTE — Telephone Encounter (Signed)
 Pharmacy change

## 2024-01-20 NOTE — ED Triage Notes (Signed)
 PT now reports she does not have ABD pain but has hip pain not ABD

## 2024-01-20 NOTE — ED Provider Notes (Signed)
 [facility]    CSN: [id number] Arrival date & time: 01/20/24  1417      History   Chief Complaint Chief Complaint  Patient presents with   Hip Pain    HPI Marcia Hobbs is a 43 y.o. female.  Patient without significant medical history presents to the urgent care today with concerns of right-sided hip pain.  Was seen several weeks ago for similar symptoms and reports brief improvement on course of prednisone  and Flexeril . States pain has worsened now that she has finished these medications. States pain typically worsens with movement and has noted pain when she spreads her hips open wide, primarily with sex. Denies any urinary or vaginal symptoms such as dysuria, discharge, drainage, or itching. No reported nausea, vomiting, or diarrhea.   Hip Pain    Past Medical History:  Diagnosis Date   Medical history non-contributory     Patient Active Problem List   Diagnosis Date Noted   Normal postpartum course 06/22/2019   Status post repeat low transverse cesarean section 06/20/2019   Advanced maternal age in multigravida, second trimester    [redacted] weeks gestation of pregnancy     Past Surgical History:  Procedure Laterality Date   CESAREAN SECTION     CESAREAN SECTION N/A 06/20/2019   Procedure: REPEAT CESAREAN SECTION;  Surgeon: Henry Slough, MD;  Location: [facility];  Service: Obstetrics;  Laterality: N/A;    OB History     Gravida  4   Para  2   Term  2   Preterm      AB  1   Living  2      SAB  1   IAB      Ectopic      Multiple  0   Live Births  1            Home Medications    Prior to Admission medications   Medication Sig Start Date End Date Taking? Authorizing Provider  ibuprofen  (ADVIL ) 600 MG tablet Take 1 tablet (600 mg total) by mouth every 6 (six) hours as needed. 01/20/24  Yes Dhruvi Crenshaw A, PA-C  cyclobenzaprine  (FLEXERIL ) 10 MG tablet Take 1 tablet (10 mg total) by mouth 2 (two) times daily as needed for  muscle spasms. 12/30/23   Ward, Harlene PEDLAR, PA-C  Prenatal Vit-Fe Fumarate-FA (PRENATAL VITAMIN PO) Take by mouth. Patient not taking: Reported on 08/20/2020    [provider]    Family History History reviewed. No pertinent family history.  Social History Social History   Tobacco Use   Smoking status: Never   Smokeless tobacco: Never  Vaping Use   Vaping status: Never Used  Substance Use Topics   Alcohol use: Never   Drug use: Never     Allergies   Patient has no known allergies.   Review of Systems Review of Systems  Musculoskeletal:        Hip pain  All other systems reviewed and are negative.    Physical Exam Triage Vital Signs ED Triage Vitals  Encounter Vitals Group     BP 01/20/24 1500 123/84     Girls Systolic BP Percentile --      Girls Diastolic BP Percentile --      Boys Systolic BP Percentile --      Boys Diastolic BP Percentile --      Pulse Rate 01/20/24 1500 76     Resp 01/20/24 1500 18     Temp  01/20/24 1500 98.5 F (36.9 C)     Temp src --      SpO2 01/20/24 1500 99 %     Weight --      Height --      Head Circumference --      Peak Flow --      Pain Score 01/20/24 1459 9     Pain Loc --      Pain Education --      Exclude from Growth Chart --    No data found.  Updated Vital Signs BP 123/84   Pulse 76   Temp 98.5 F (36.9 C)   Resp 18   LMP 12/30/2023   SpO2 99%   Visual Acuity Right Eye Distance:   Left Eye Distance:   Bilateral Distance:    Right Eye Near:   Left Eye Near:    Bilateral Near:     Physical Exam Vitals and nursing note reviewed.  Constitutional:      General: She is not in acute distress.    Appearance: She is well-developed.  HENT:     Head: Normocephalic and atraumatic.  Eyes:     Conjunctiva/sclera: Conjunctivae normal.  Cardiovascular:     Rate and Rhythm: Normal rate and regular rhythm.     Heart sounds: No murmur heard. Pulmonary:     Effort: Pulmonary effort is normal. No  respiratory distress.     Breath sounds: Normal breath sounds.  Abdominal:     General: Abdomen is flat. Bowel sounds are normal. There is no distension.     Palpations: Abdomen is soft.     Tenderness: There is no abdominal tenderness. There is no guarding.  Musculoskeletal:        General: Tenderness present. No swelling, deformity or signs of injury.     Cervical back: Neck supple.       Legs:     Comments: TTP along the right hip with deep palpation. Negative straight leg raise. External and internal rotation of the hip worsens pain.  Skin:    General: Skin is warm and dry.     Capillary Refill: Capillary refill takes less than 2 seconds.  Neurological:     Mental Status: She is alert.  Psychiatric:        Mood and Affect: Mood normal.      UC Treatments / Results  Labs (all labs ordered are listed, but only abnormal results are displayed) Labs Reviewed  POCT URINALYSIS DIP (MANUAL ENTRY) - Abnormal; Notable for the following components:      Result Value   Color, UA other (*)    Glucose, UA negative (*)    All other components within normal limits  POCT URINE PREGNANCY    EKG   Radiology No results found.  Procedures Procedures (including critical care time)  Medications Ordered in UC Medications - No data to display  Initial Impression / Assessment and Plan / UC Course  I have reviewed the triage vital signs and the nursing notes.  Pertinent labs & imaging results that were available during my care of the patient were reviewed by me and considered in my medical decision making (see chart for details).     This patient presents to the UC for concern of hip pain.  Differential diagnosis includes osteoarthritis, pelvic fracture, hip dislocation, trochanteric bursitis   Lab Tests:  I Ordered, and personally interpreted labs.  The pertinent results include: Urine dipstick negative for any signs of hemoglobin  or infection, urine pregnancy  negative   Imaging Studies ordered:  I ordered imaging studies including x-ray of the right hip/pelvis I independently visualized and interpreted imaging which showed no acute findings from personal interpretation I agree with the radiologist interpretation   Problem List / UC Course:  Patient without significant medical history presents to urgent care today with concerns of right hip pain.  Reports she was initially seen for similar concerns several weeks ago and has intermittent episodes of improvement while on prednisone  and Flexeril .  Endorses ongoing pain to the right hip primarily worsened with activity.  Denies any recent injuries, fall, or other trauma to the area.  Denies any urinary symptoms, abdominal pain, nausea, vomiting, diarrhea. On exam, patient has focal tenderness to the right hip with palpation.  Negative straight leg raise.  Pain worsens with external/internal rotation of the hip.  Concern for possible arthritic type findings in this area.  Will obtain x-ray for evaluation of hip injury. Urine negative for infection or pregnancy. Xray imaging negative for any acute findings on my personal interpretation. Given lack of traumatic mechanism and low concern for infectious process, suspect likely arthritic process ongoing. Advised use of high dose NSAIDs and close PCP as well orthopedic follow up if symptoms not improving. Patient verbalized understanding and agreement. Discharged home in stable condition.   Social Determinants of Health:  None  Final Clinical Impressions(s) / UC Diagnoses   Final diagnoses:  Right hip pain     Discharge Instructions      ?? ???? ?? ??? ??????? ????? ???? ??? ?? ?????. ?? ????? ?????? ??????? ?? ?????? ?????? ???? ???? ??? ????? ?? ??? ?? ?????? ????? ????? ??????? ??? ???? ?? ?????? ?????. ?????? ????? ????? ?? ??????? ??????? ??????????? ??? ???????????? ?? ???????????? ?????? ??? ?????. ?????? ??????? ????? ???????? ????? ?????  ???????? ???????? ?????? ???????? ?? ????? ?????? ?? ??? ??????. ?? ??? ???? ?? ????? ?? ????? ???????? ????? ?????? ????? ?? ???? ??????? ?????? ??????? ?????? ????? ?????. tama fahsuk fi qism altawari alyawm bisabab 'alam fi alwarki. lam tuzhr al'ashieat alsiyniat 'aya ealamat 'iisabatin, walakin yabdu 'anuk tueani min 'alam fi almufasil nafsihi, waladhi yurjah 'anah natij ean ailtihab mafasili. aistakhdim jurueat ealiatan min al'adwiat almudadat lilailtihabati, mithl al'iibubrufin 'aw alnaabruksin, litakhfif hadha al'almi. tjnab al'anshitat ealiat altaathira, wahawal ziadat altamarin tdryjyan litaqlil alainzieaj fi alwark alnaatij ean eadam alnashati. fi hal wujud 'ayi makhawif min tafaqum zipporah laundry mutabaeat halatik mae maqadam alrieayat alsihiyat al'awaliat li'iijra' taqyim 'iidafiin.  You were seen in the ER today for concerns of hip pain. Your xray does not show and signs of injury, but you appear to have pain within the joint itself which is likely due to some level of arthritis. Use high dose anti-inflammatory medications such as ibuprofen  or naproxen to help with this this pain. Avoid high impact activity and try to increase exercise slowly to help reduce discomfort in this hip from inactivity. For any concerns of worsening symptoms, follow up with your primary care provider for further assessment.     ED Prescriptions     Medication Sig Dispense Auth. Provider   ibuprofen  (ADVIL ) 600 MG tablet Take 1 tablet (600 mg total) by mouth every 6 (six) hours as needed. 30 tablet Aayat Hajjar A, PA-C      PDMP not reviewed this encounter.   Lydie Stammen A, PA-C 01/20/24 1622

## 2024-01-20 NOTE — Telephone Encounter (Signed)
 PT changed the Pharmacy

## 2024-01-20 NOTE — ED Triage Notes (Addendum)
 PT seen on Sept 6 for same RT sided hip pain

## 2024-01-20 NOTE — Discharge Instructions (Addendum)
?? ???? ?? ??? ??????? ????? ???? ??? ?? ?????. ?? ????? ?????? ??????? ?? ?????? ?????? ???? ???? ??? ????? ?? ??? ?? ?????? ????? ????? ??????? ??? ???? ?? ?????? ?????. ?????? ????? ????? ?? ??????? ??????? ??????????? ??? ???????????? ?? ???????????? ?????? ??? ?????. ?????? ??????? ????? ???????? ????? ????? ???????? ???????? ?????? ???????? ?? ????? ?????? ?? ??? ??????. ?? ??? ???? ?? ????? ?? ????? ???????? ????? ?????? ????? ?? ???? ??????? ?????? ??????? ?????? ????? ?????.   tama fahsuk fi qism altawari alyawm bisabab 'alam fi alwarki. lam tuzhr al'ashieat alsiyniat 'aya ealamat 'iisabatin, walakin yabdu 'anuk tueani min 'alam fi almufasil nafsihi, waladhi yurjah 'anah natij ean ailtihab mafasili. aistakhdim jurueat ealiatan min al'adwiat almudadat lilailtihabati, mithl al'iibubrufin 'aw alnaabruksin, litakhfif hadha al'almi. tjnab al'anshitat ealiat altaathira, wahawal ziadat altamarin tdryjyan litaqlil alainzieaj fi alwark alnaatij ean eadam alnashati. fi hal wujud 'ayi makhawif min tafaqum zipporah laundry mutabaeat halatik mae maqadam alrieayat alsihiyat al'awaliat li'iijra' taqyim 'iidafiin.  You were seen in the ER today for concerns of hip pain. Your xray does not show and signs of injury, but you appear to have pain within the joint itself which is likely due to some level of arthritis. Use high dose anti-inflammatory medications such as ibuprofen  or naproxen to help with this this pain. Avoid high impact activity and try to increase exercise slowly to help reduce discomfort in this hip from inactivity. For any concerns of worsening symptoms, follow up with your primary care provider for further assessment.

## 2024-01-21 ENCOUNTER — Other Ambulatory Visit: Payer: Self-pay
